# Patient Record
Sex: Male | Born: 1980 | Race: White | Hispanic: No | Marital: Married | Smoking: Never smoker
Health system: Southern US, Community
[De-identification: ages and names within clinical notes are randomized; demographics above are authoritative.]

## PROBLEM LIST (undated history)

## (undated) DIAGNOSIS — F419 Anxiety disorder, unspecified: Secondary | ICD-10-CM

## (undated) DIAGNOSIS — F209 Schizophrenia, unspecified: Secondary | ICD-10-CM

## (undated) DIAGNOSIS — H348112 Central retinal vein occlusion, right eye, stable: Secondary | ICD-10-CM

## (undated) DIAGNOSIS — F32A Depression, unspecified: Secondary | ICD-10-CM

## (undated) DIAGNOSIS — G43909 Migraine, unspecified, not intractable, without status migrainosus: Secondary | ICD-10-CM

## (undated) DIAGNOSIS — K219 Gastro-esophageal reflux disease without esophagitis: Secondary | ICD-10-CM

---
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
## 2017-08-25 DIAGNOSIS — J302 Other seasonal allergic rhinitis: Secondary | ICD-10-CM | POA: Insufficient documentation

## 2017-09-29 DIAGNOSIS — F419 Anxiety disorder, unspecified: Secondary | ICD-10-CM | POA: Insufficient documentation

## 2018-07-27 DIAGNOSIS — E6609 Other obesity due to excess calories: Secondary | ICD-10-CM | POA: Insufficient documentation

## 2019-07-22 DIAGNOSIS — F5101 Primary insomnia: Secondary | ICD-10-CM | POA: Insufficient documentation

## 2019-07-22 DIAGNOSIS — R37 Sexual dysfunction, unspecified: Secondary | ICD-10-CM | POA: Insufficient documentation

## 2019-07-22 DIAGNOSIS — E7800 Pure hypercholesterolemia, unspecified: Secondary | ICD-10-CM | POA: Insufficient documentation

## 2021-05-03 DIAGNOSIS — R55 Syncope and collapse: Secondary | ICD-10-CM

## 2021-05-03 HISTORY — DX: Syncope and collapse: R55

## 2021-05-06 ENCOUNTER — Other Ambulatory Visit: Payer: Self-pay

## 2021-05-06 ENCOUNTER — Emergency Department (HOSPITAL_COMMUNITY): Payer: Medicaid Other

## 2021-05-06 ENCOUNTER — Observation Stay (HOSPITAL_COMMUNITY)
Admission: EM | Admit: 2021-05-06 | Discharge: 2021-05-07 | DRG: 281 | Disposition: A | Discharge status: Home / Self Care | Payer: Medicaid Other | Attending: Family Medicine | Admitting: Family Medicine

## 2021-05-06 ENCOUNTER — Encounter (HOSPITAL_COMMUNITY): Payer: Self-pay | Admitting: Internal Medicine

## 2021-05-06 ENCOUNTER — Observation Stay (HOSPITAL_COMMUNITY): Payer: Medicaid Other

## 2021-05-06 DIAGNOSIS — F200 Paranoid schizophrenia: Secondary | ICD-10-CM | POA: Diagnosis present

## 2021-05-06 DIAGNOSIS — F419 Anxiety disorder, unspecified: Secondary | ICD-10-CM | POA: Diagnosis present

## 2021-05-06 DIAGNOSIS — I319 Disease of pericardium, unspecified: Secondary | ICD-10-CM | POA: Diagnosis not present

## 2021-05-06 DIAGNOSIS — H348112 Central retinal vein occlusion, right eye, stable: Secondary | ICD-10-CM | POA: Diagnosis present

## 2021-05-06 DIAGNOSIS — G43909 Migraine, unspecified, not intractable, without status migrainosus: Secondary | ICD-10-CM | POA: Diagnosis present

## 2021-05-06 DIAGNOSIS — I213 ST elevation (STEMI) myocardial infarction of unspecified site: Secondary | ICD-10-CM | POA: Diagnosis not present

## 2021-05-06 DIAGNOSIS — R55 Syncope and collapse: Secondary | ICD-10-CM | POA: Diagnosis present

## 2021-05-06 DIAGNOSIS — R778 Other specified abnormalities of plasma proteins: Secondary | ICD-10-CM | POA: Diagnosis not present

## 2021-05-06 DIAGNOSIS — F32A Depression, unspecified: Secondary | ICD-10-CM | POA: Diagnosis present

## 2021-05-06 DIAGNOSIS — Z20822 Contact with and (suspected) exposure to covid-19: Secondary | ICD-10-CM | POA: Diagnosis present

## 2021-05-06 DIAGNOSIS — K219 Gastro-esophageal reflux disease without esophagitis: Secondary | ICD-10-CM | POA: Diagnosis not present

## 2021-05-06 DIAGNOSIS — F209 Schizophrenia, unspecified: Secondary | ICD-10-CM | POA: Diagnosis present

## 2021-05-06 DIAGNOSIS — R7989 Other specified abnormal findings of blood chemistry: Secondary | ICD-10-CM | POA: Diagnosis present

## 2021-05-06 HISTORY — DX: Anxiety disorder, unspecified: F41.9

## 2021-05-06 HISTORY — DX: Gastro-esophageal reflux disease without esophagitis: K21.9

## 2021-05-06 HISTORY — DX: Schizophrenia, unspecified: F20.9

## 2021-05-06 HISTORY — DX: Central retinal vein occlusion, right eye, stable: H34.8112

## 2021-05-06 HISTORY — DX: Migraine, unspecified, not intractable, without status migrainosus: G43.909

## 2021-05-06 HISTORY — DX: Depression, unspecified: F32.A

## 2021-05-06 LAB — CBC
HCT: 39.8 % (ref 39.0–52.0)
Hemoglobin: 13.9 g/dL (ref 13.0–17.0)
MCH: 29.1 pg (ref 26.0–34.0)
MCHC: 34.9 g/dL (ref 30.0–36.0)
MCV: 83.4 fL (ref 80.0–100.0)
Platelets: 296 10*3/uL (ref 150–400)
RBC: 4.77 MIL/uL (ref 4.22–5.81)
RDW: 14 % (ref 11.5–15.5)
WBC: 11.5 10*3/uL — ABNORMAL HIGH (ref 4.0–10.5)
nRBC: 0 % (ref 0.0–0.2)

## 2021-05-06 LAB — TROPONIN I (HIGH SENSITIVITY)
Troponin I (High Sensitivity): 3400 ng/L (ref <18)
Troponin I (High Sensitivity): 3371 ng/L (ref <18)

## 2021-05-06 LAB — BASIC METABOLIC PANEL
Anion gap: 8 (ref 5–15)
BUN: 14 mg/dL (ref 6–20)
CO2: 26 mmol/L (ref 22–32)
Calcium: 9.3 mg/dL (ref 8.9–10.3)
Chloride: 103 mmol/L (ref 98–111)
Creatinine, Ser: 0.85 mg/dL (ref 0.61–1.24)
GFR, Estimated: >60 mL/min (ref >60)
Glucose, Bld: 108 mg/dL — ABNORMAL HIGH (ref 70–99)
Potassium: 3.7 mmol/L (ref 3.5–5.1)
Sodium: 137 mmol/L (ref 135–145)

## 2021-05-06 LAB — RAPID URINE DRUG SCREEN, HOSP PERFORMED
Amphetamines: NOT DETECTED
Barbiturates: NOT DETECTED
Benzodiazepines: POSITIVE — AB
Cocaine: NOT DETECTED
Opiates: NOT DETECTED
Tetrahydrocannabinol: NOT DETECTED

## 2021-05-06 LAB — HIV ANTIBODY (ROUTINE TESTING W REFLEX): HIV Screen 4th Generation wRfx: NONREACTIVE

## 2021-05-06 LAB — RESP PANEL BY RT-PCR (FLU A&B, COVID) ARPGX2
Influenza A by PCR: NEGATIVE
Influenza B by PCR: NEGATIVE
SARS Coronavirus 2 by RT PCR: NEGATIVE

## 2021-05-06 IMAGING — CR DG CHEST 1V
1 series · 1 of 1 positions shown · non-contrast
Comparison: Radiograph [DATE].  CT [DATE].

CLINICAL DATA: Lightheadedness and dizziness. Syncopal episode
earlier today.

EXAM:
CHEST  1 VIEW

[chest pa]
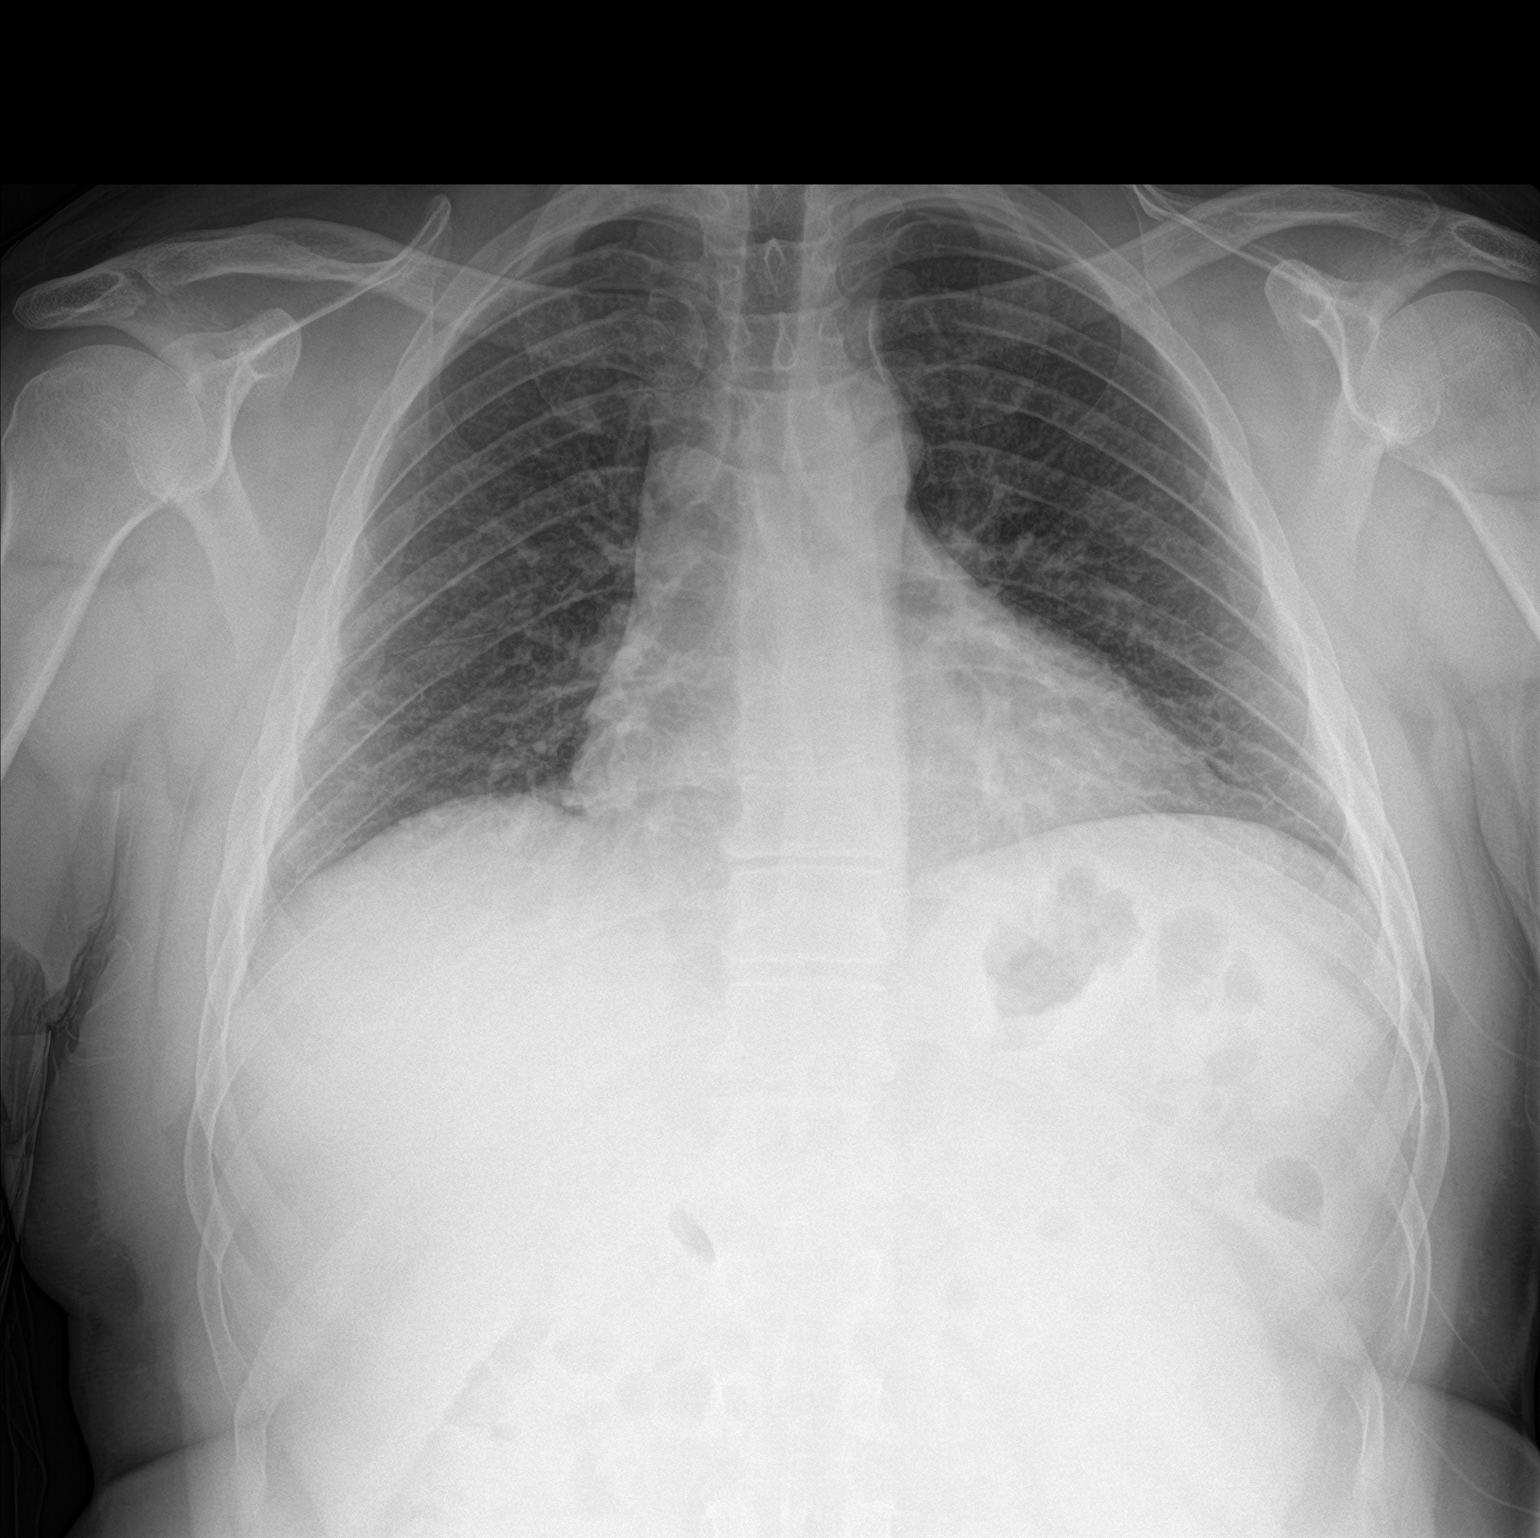

[1 of 1 positions shown; findings below may reference images not displayed]

FINDINGS: [FD] hours. Low lung volumes. The heart size and mediastinal
contours are stable. There is central airway thickening, but no
edema, confluent airspace opacity, pleural effusion or pneumothorax.
The bones appear unremarkable.
IMPRESSION: Suboptimal inspiration and mild central airway thickening. No focal
airspace disease.

## 2021-05-06 IMAGING — CT CT ANGIO HEAD-NECK (W OR W/O PERF)
2 of 7 series · 8 of 33 positions shown · non-contrast
Comparison: None.

CLINICAL DATA: Dizziness and lightheadedness

EXAM:
CT ANGIOGRAPHY HEAD AND NECK
TECHNIQUE: Multidetector CT imaging of the head and neck was performed using
the standard protocol during bolus administration of intravenous
contrast. Multiplanar CT image reconstructions and MIPs were
obtained to evaluate the vascular anatomy. Carotid stenosis
measurements (when applicable) are obtained utilizing NASCET
criteria, using the distal internal carotid diameter as the
denominator.

[Series 5: cta neck · axial · 0.49mm/px · z∈[-178,-64]mm · 2 of 172 slices shown]
[im 58/172  soft-tissue]
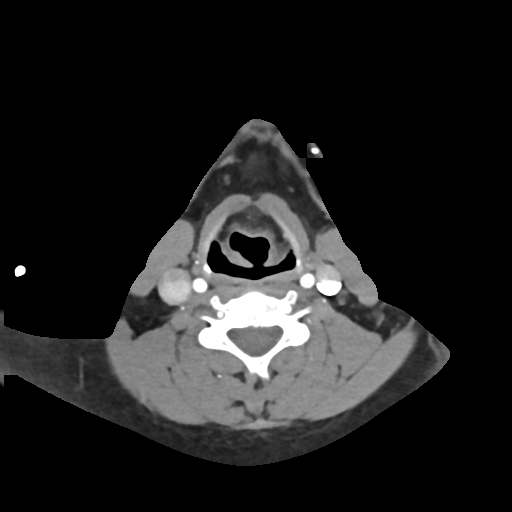
[im 115/172  soft-tissue]
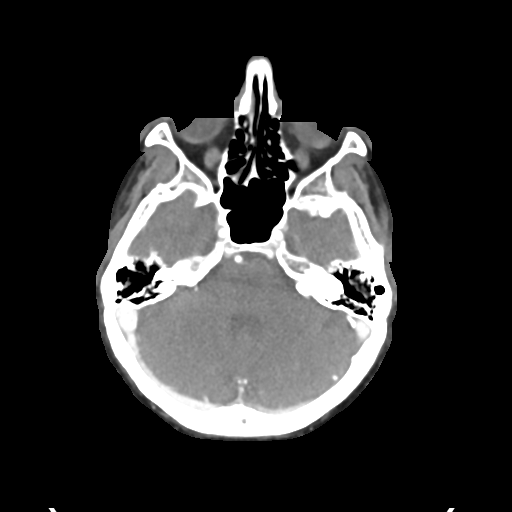

[Series 7: cta neck axial · axial · 0.39mm/px · z∈[-242,+3]mm · 6 of 344 slices shown]
[im 50/344  soft-tissue]
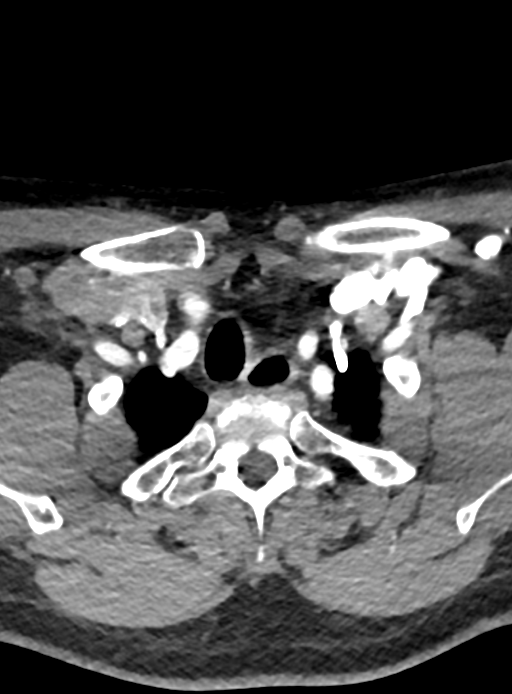
[im 99/344  bone]
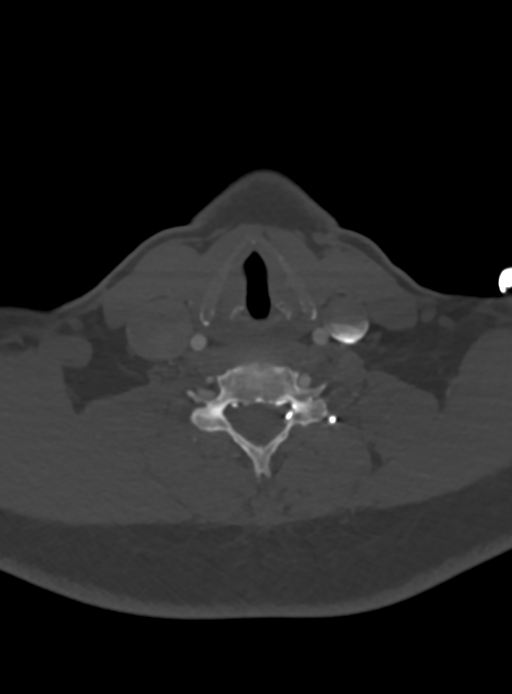
[im 148/344  soft-tissue]
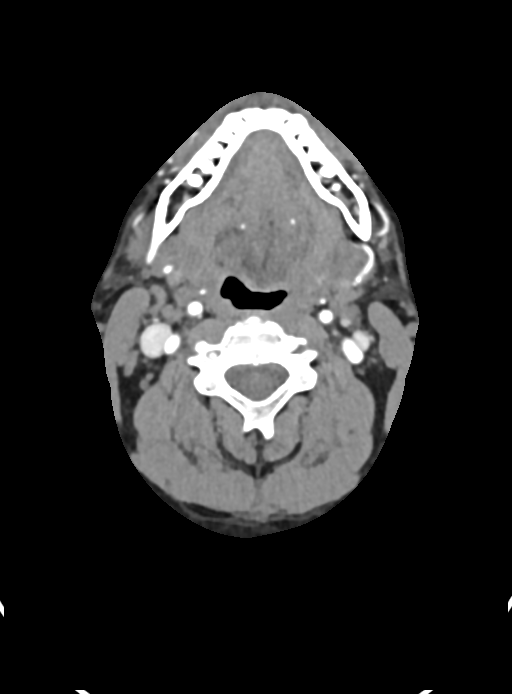
[im 197/344  bone]
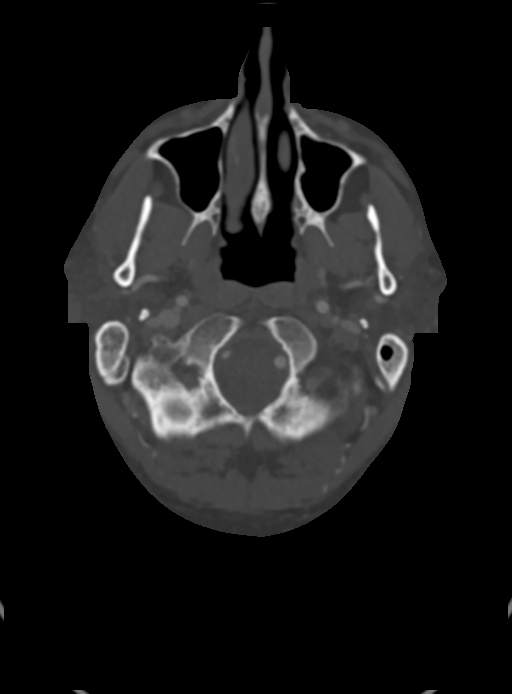
[im 246/344  soft-tissue]
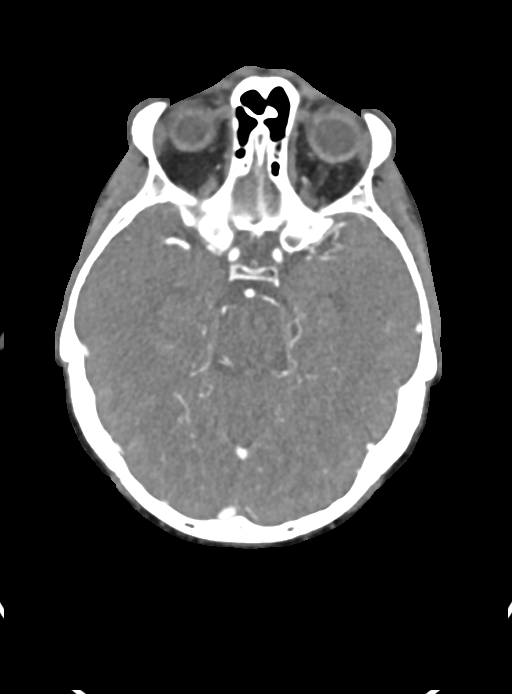
[im 295/344  bone]
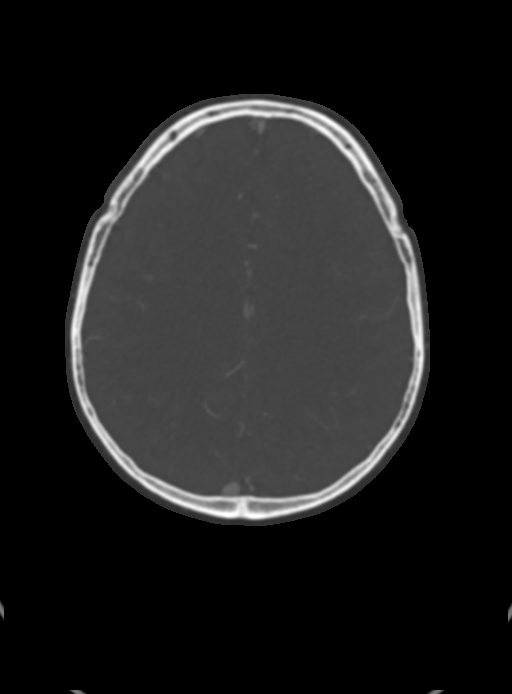

[8 of 33 positions shown; findings below may reference images not displayed]

RADIATION DOSE REDUCTION: This exam was performed according to the
departmental dose-optimization program which includes automated
exposure control, adjustment of the mA and/or kV according to
patient size and/or use of iterative reconstruction technique.

CONTRAST:  75mL OMNIPAQUE IOHEXOL 350 MG/ML SOLN
FINDINGS: CTA NECK FINDINGS

SKELETON: There is no bony spinal canal stenosis. No lytic or
blastic lesion.

OTHER NECK: Normal pharynx, larynx and major salivary glands. No
cervical lymphadenopathy. Unremarkable thyroid gland.

UPPER CHEST: No pneumothorax or pleural effusion. No nodules or
masses.

AORTIC ARCH:

There is no calcific atherosclerosis of the aortic arch. There is no
aneurysm, dissection or hemodynamically significant stenosis of the
visualized portion of the aorta. Conventional 3 vessel aortic
branching pattern. The visualized proximal subclavian arteries are
widely patent.

RIGHT CAROTID SYSTEM: Normal without aneurysm, dissection or
stenosis.

LEFT CAROTID SYSTEM: Normal without aneurysm, dissection or
stenosis.

VERTEBRAL ARTERIES: Left dominant configuration. Both origins are
clearly patent. There is no dissection, occlusion or flow-limiting
stenosis to the skull base (V1-V3 segments).

CTA HEAD FINDINGS

POSTERIOR CIRCULATION:

--Vertebral arteries: Normal V4 segments.

--Inferior cerebellar arteries: Normal.

--Basilar artery: Normal.

--Superior cerebellar arteries: Normal.

--Posterior cerebral arteries (PCA): Normal.

ANTERIOR CIRCULATION:

--Intracranial internal carotid arteries: Normal.

--Anterior cerebral arteries (ACA): Normal. Both A1 segments are
present. Patent anterior communicating artery (a-comm).

--Middle cerebral arteries (MCA): Normal.

VENOUS SINUSES: As permitted by contrast timing, patent.

ANATOMIC VARIANTS: None

Review of the MIP images confirms the above findings.
IMPRESSION: Normal CTA of the head and neck.
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
## 2021-05-06 MED ORDER — LACTATED RINGERS IV SOLN: INTRAVENOUS | Status: DC

## 2021-05-06 MED ORDER — ARIPIPRAZOLE 10 MG PO TABS
20.0000 mg | ORAL_TABLET | Freq: Every day | ORAL | Status: DC
  Administered 2021-05-06: 20 mg via ORAL
  Filled 2021-05-06 (×2): qty 2

## 2021-05-06 MED ORDER — ACETAMINOPHEN 325 MG PO TABS: 650.0000 mg | ORAL_TABLET | Freq: Four times a day (QID) | ORAL | Status: DC | PRN

## 2021-05-06 MED ORDER — ACETAMINOPHEN 650 MG RE SUPP: 650.0000 mg | Freq: Four times a day (QID) | RECTAL | Status: DC | PRN

## 2021-05-06 MED ORDER — TEMAZEPAM 30 MG PO CAPS
30.0000 mg | ORAL_CAPSULE | Freq: Every day | ORAL | Status: DC
  Administered 2021-05-06: 30 mg via ORAL
  Filled 2021-05-06: qty 1

## 2021-05-06 MED ORDER — IOHEXOL 350 MG/ML SOLN
75.0000 mL | Freq: Once | INTRAVENOUS | Status: AC | PRN
  Administered 2021-05-06: 75 mL via INTRAVENOUS

## 2021-05-06 MED ORDER — ENOXAPARIN SODIUM 40 MG/0.4ML IJ SOSY
40.0000 mg | PREFILLED_SYRINGE | INTRAMUSCULAR | Status: DC
  Administered 2021-05-06: 40 mg via SUBCUTANEOUS
  Filled 2021-05-06: qty 0.4

## 2021-05-06 MED ORDER — ONDANSETRON HCL 4 MG/2ML IJ SOLN: 4.0000 mg | Freq: Four times a day (QID) | INTRAMUSCULAR | Status: DC | PRN

## 2021-05-06 MED ORDER — SODIUM CHLORIDE 0.9% FLUSH
3.0000 mL | Freq: Two times a day (BID) | INTRAVENOUS | Status: DC
  Administered 2021-05-07: 3 mL via INTRAVENOUS

## 2021-05-06 MED ORDER — ASPIRIN EC 81 MG PO TBEC: 81.0000 mg | DELAYED_RELEASE_TABLET | Freq: Every day | ORAL | Status: DC

## 2021-05-06 MED ORDER — ONDANSETRON HCL 4 MG PO TABS: 4.0000 mg | ORAL_TABLET | Freq: Four times a day (QID) | ORAL | Status: DC | PRN

## 2021-05-06 MED ORDER — HYDROXYZINE HCL 25 MG PO TABS
50.0000 mg | ORAL_TABLET | Freq: Every evening | ORAL | Status: DC
  Administered 2021-05-06: 50 mg via ORAL
  Filled 2021-05-06: qty 2

## 2021-05-06 MED ORDER — ASPIRIN 81 MG PO CHEW
324.0000 mg | CHEWABLE_TABLET | Freq: Once | ORAL | Status: AC
  Administered 2021-05-06: 324 mg via ORAL
  Filled 2021-05-06: qty 4

## 2021-05-06 MED ORDER — PANTOPRAZOLE SODIUM 40 MG PO TBEC
40.0000 mg | DELAYED_RELEASE_TABLET | Freq: Every day | ORAL | Status: DC
  Administered 2021-05-06: 40 mg via ORAL
  Filled 2021-05-06: qty 1

## 2021-05-06 NOTE — Progress Notes (Signed)
EEG complete - results pending 
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
## 2021-05-06 NOTE — Procedures (Signed)
Patient Name: [name]  ?MRN: [id number]  ?Epilepsy Attending: Charlsie Quest  ?Referring Physician/Provider: Jonah Blue, MD ?Date: 05/06/2021 ?Duration: 23.09 mins ? ?Patient history: 41 year old male with recurrent episodes of syncope.  Given his previous description of presyncope with blood donation and syncope associated with vaccination. EEG to evaluate for seizure. ? ?Level of alertness: Awake ? ?AEDs during EEG study: None ? ?Technical aspects: This EEG study was done with scalp electrodes positioned according to the 10-20 International system of electrode placement. Electrical activity was acquired at a sampling rate of 500Hz  and reviewed with a high frequency filter of 70Hz  and a low frequency filter of 1Hz . EEG data were recorded continuously and digitally stored.  ? ?Description: The posterior dominant rhythm consists of 9 Hz activity of moderate voltage (25-35 uV) seen predominantly in posterior head regions, symmetric and reactive to eye opening and eye closing. Physiologic photic driving was seen during photic stimulation.  Hyperventilation was not performed.    ? ?IMPRESSION: ?This study is within normal limits. No seizures or epileptiform discharges were seen throughout the recording. ? ?  ? ?

## 2021-05-06 NOTE — ED Notes (Signed)
Critical Troponin of 3,400 reported to [facility] in triage, charge RN made aware.  ?
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
## 2021-05-07 ENCOUNTER — Observation Stay (HOSPITAL_COMMUNITY): Payer: Medicaid Other

## 2021-05-07 ENCOUNTER — Encounter: Payer: Self-pay | Admitting: Family Medicine

## 2021-05-07 ENCOUNTER — Other Ambulatory Visit: Payer: Self-pay | Admitting: Cardiology

## 2021-05-07 ENCOUNTER — Observation Stay (HOSPITAL_BASED_OUTPATIENT_CLINIC_OR_DEPARTMENT_OTHER)
Admit: 2021-05-07 | Discharge: 2021-05-07 | Disposition: A | Discharge status: Home / Self Care | Payer: Medicaid Other | Attending: Cardiovascular Disease | Admitting: Cardiovascular Disease

## 2021-05-07 DIAGNOSIS — R55 Syncope and collapse: Secondary | ICD-10-CM

## 2021-05-07 DIAGNOSIS — I401 Isolated myocarditis: Secondary | ICD-10-CM | POA: Diagnosis not present

## 2021-05-07 DIAGNOSIS — R778 Other specified abnormalities of plasma proteins: Secondary | ICD-10-CM | POA: Diagnosis not present

## 2021-05-07 LAB — BASIC METABOLIC PANEL
Anion gap: 6 (ref 5–15)
BUN: 16 mg/dL (ref 6–20)
CO2: 27 mmol/L (ref 22–32)
Calcium: 9.1 mg/dL (ref 8.9–10.3)
Chloride: 103 mmol/L (ref 98–111)
Creatinine, Ser: 0.8 mg/dL (ref 0.61–1.24)
GFR, Estimated: >60 mL/min (ref >60)
Glucose, Bld: 101 mg/dL — ABNORMAL HIGH (ref 70–99)
Potassium: 4.3 mmol/L (ref 3.5–5.1)
Sodium: 136 mmol/L (ref 135–145)

## 2021-05-07 LAB — CBC
HCT: 38.6 % — ABNORMAL LOW (ref 39.0–52.0)
Hemoglobin: 13.4 g/dL (ref 13.0–17.0)
MCH: 29 pg (ref 26.0–34.0)
MCHC: 34.7 g/dL (ref 30.0–36.0)
MCV: 83.5 fL (ref 80.0–100.0)
Platelets: 300 10*3/uL (ref 150–400)
RBC: 4.62 MIL/uL (ref 4.22–5.81)
RDW: 14.1 % (ref 11.5–15.5)
WBC: 9.7 10*3/uL (ref 4.0–10.5)
nRBC: 0 % (ref 0.0–0.2)

## 2021-05-07 LAB — TROPONIN I (HIGH SENSITIVITY): Troponin I (High Sensitivity): 2444 ng/L (ref <18)

## 2021-05-07 IMAGING — MR MR HEAD W/O CM
14 of 15 series · 44 of 48 positions shown · non-contrast
Comparison: Head CT and CTA from yesterday

CLINICAL DATA: Syncope/presyncope with cerebrovascular cause
suspected

EXAM:
MRI HEAD WITHOUT CONTRAST
TECHNIQUE: Multiplanar, multiecho pulse sequences of the brain and surrounding
structures were obtained without intravenous contrast.

[Series 5: DWI · axial · 3.0mm · 0.88mm/px · z∈[-71,+86]mm · 8 of 108 slices shown (1 of 4)]
[im 1/108]
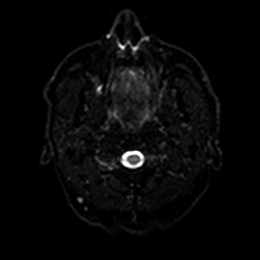
[im 16/108]
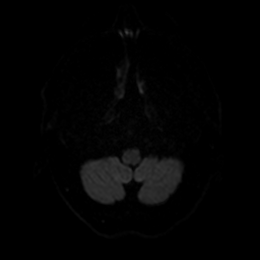
[im 31/108]
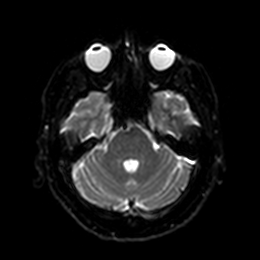
[im 46/108]
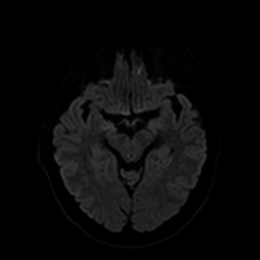
[im 62/108]
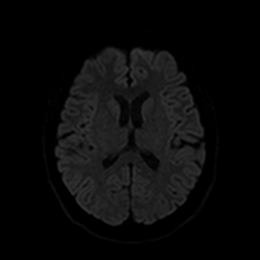
[im 77/108]
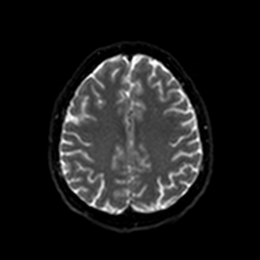
[im 92/108]
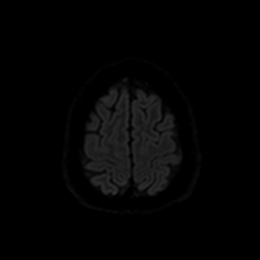
[im 108/108]
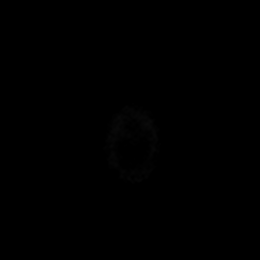

[Series 6: DWI · axial · 3.0mm · 0.88mm/px · z∈[-71,+86]mm · 3 of 54 slices shown (2 of 4)]
[im 1/54]
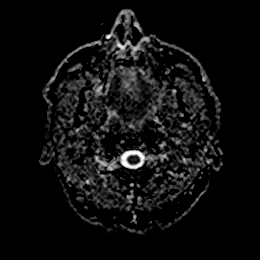
[im 27/54]
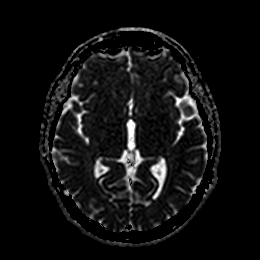
[im 54/54]
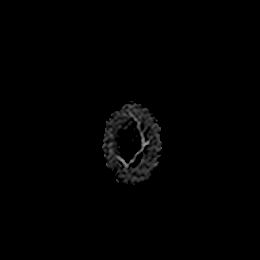

[Series 7: DWI · coronal · 4.0mm · 0.88mm/px · 5 of 76 slices shown (3 of 4)]
[im 1/76]
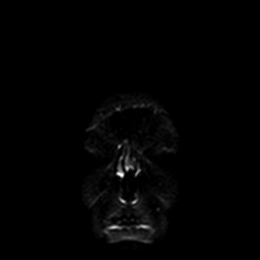
[im 19/76]
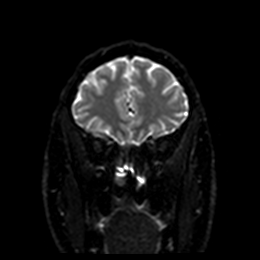
[im 38/76]
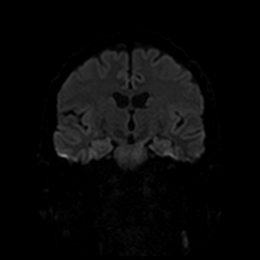
[im 57/76]
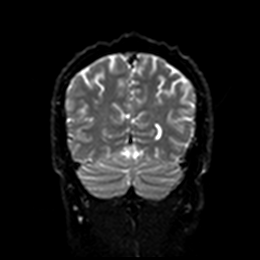
[im 76/76]
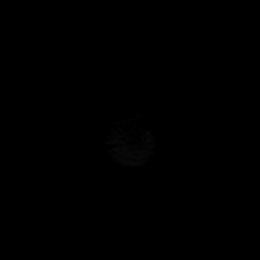

[Series 8: DWI · coronal · 4.0mm · 0.88mm/px · 2 of 38 slices shown (4 of 4)]
[im 1/38]
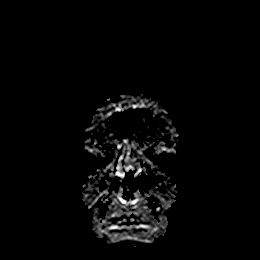
[im 38/38]
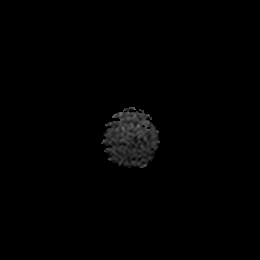

[Series 9: T1 · sagittal · 5.0mm · 0.78mm/px · 1 of 23 slices shown]
[im 1/23]
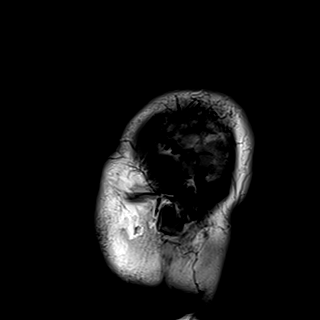

[Series 10: T2 · axial · 5.0mm · 0.72mm/px · z∈[-82,+59]mm · 2 of 25 slices shown (1 of 3)]
[im 1/25]
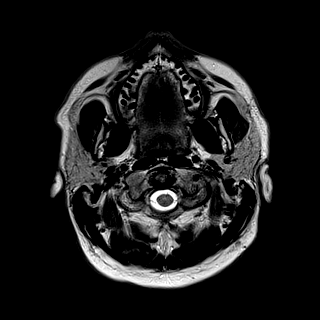
[im 25/25]
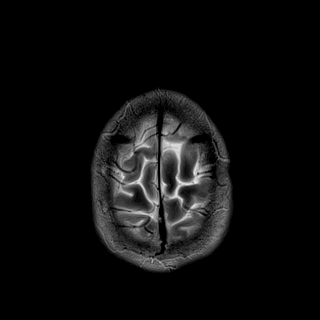

[Series 11: FLAIR · axial · 5.0mm · 0.45mm/px · z∈[-83,+58]mm · 2 of 25 slices shown (1 of 2)]
[im 1/25]
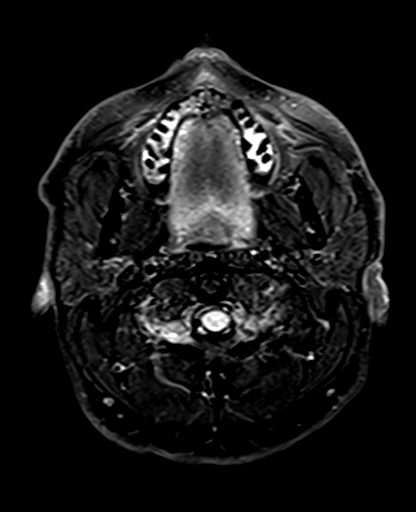
[im 25/25]
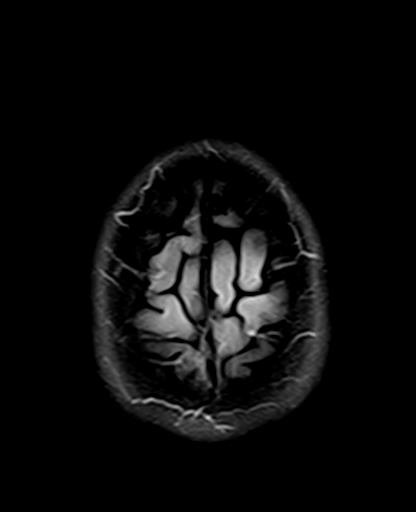

[Series 12: mag_images · axial · 3.0mm · 0.90mm/px · z∈[-77,+98]mm · 4 of 60 slices shown]
[im 1/60]
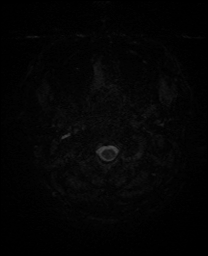
[im 20/60]
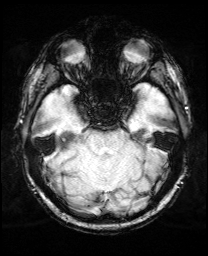
[im 40/60]
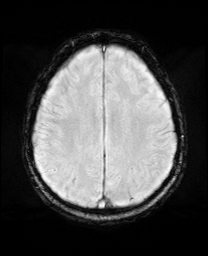
[im 60/60]
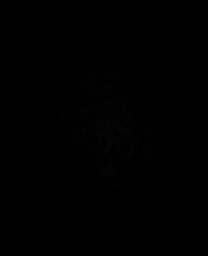

[Series 13: pha_images · axial · 3.0mm · 0.90mm/px · z∈[-77,+95]mm · 4 of 58 slices shown]
[im 1/58]
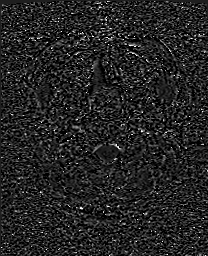
[im 20/58]
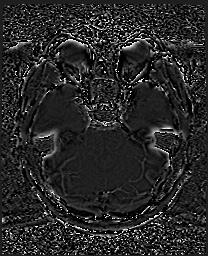
[im 39/58]
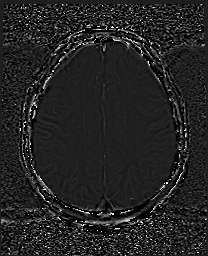
[im 58/58]
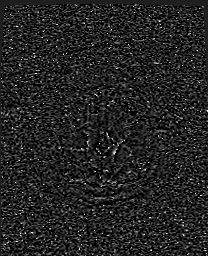

[Series 14: swi_images · axial · 3.0mm · 0.90mm/px · z∈[-77,+98]mm · 4 of 60 slices shown]
[im 1/60]
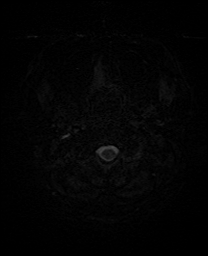
[im 20/60]
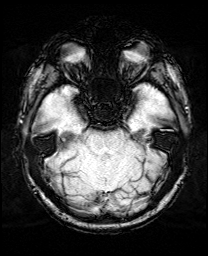
[im 40/60]
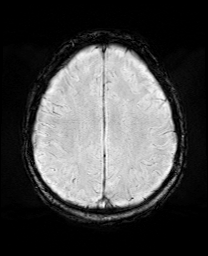
[im 60/60]
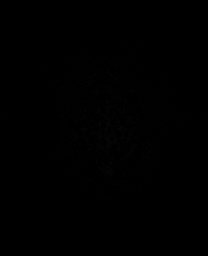

[Series 15: mip_images(sw) · axial · 24.0mm · 0.90mm/px · z∈[-67,+87]mm · 3 of 53 slices shown]
[im 1/53]
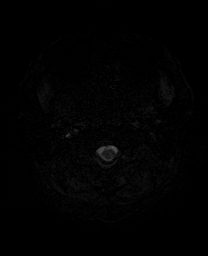
[im 27/53]
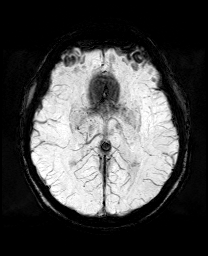
[im 53/53]
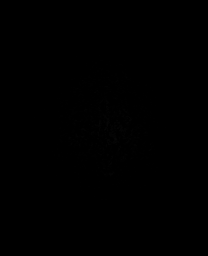

[Series 17: T2 · coronal · 5.0mm · 0.34mm/px · 2 of 29 slices shown (2 of 3)]
[im 1/29]
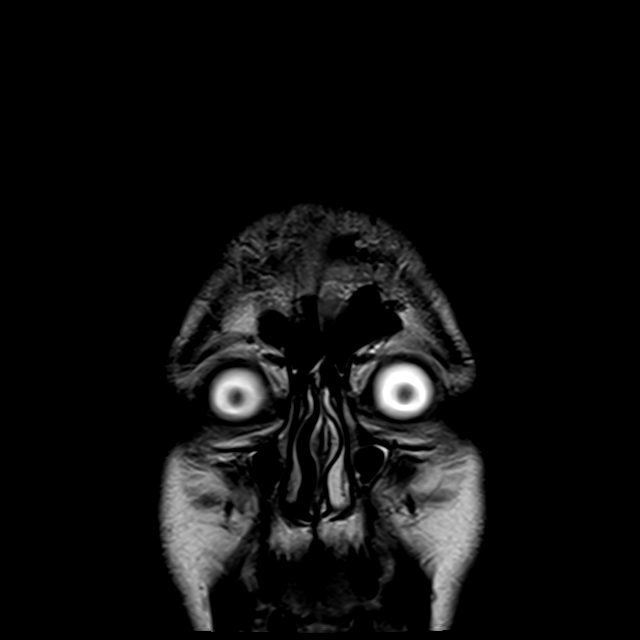
[im 29/29]
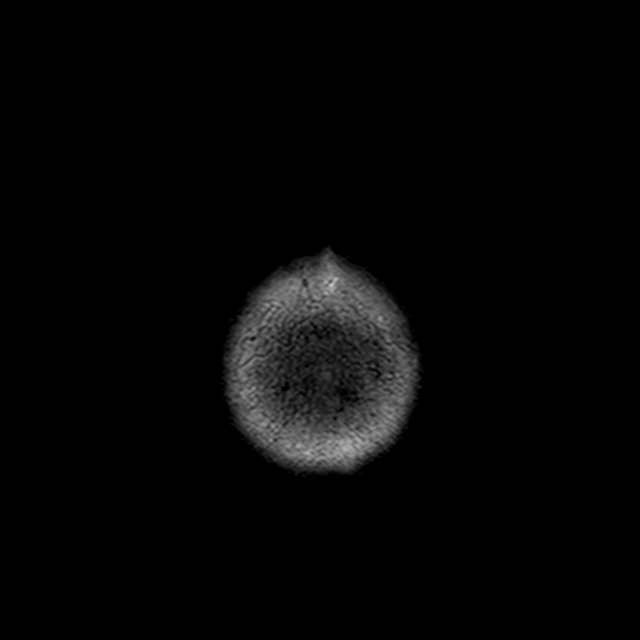

[Series 18: T2 · oblique · 3.0mm · 0.30mm/px · 2 of 38 slices shown (3 of 3)]
[im 1/38]
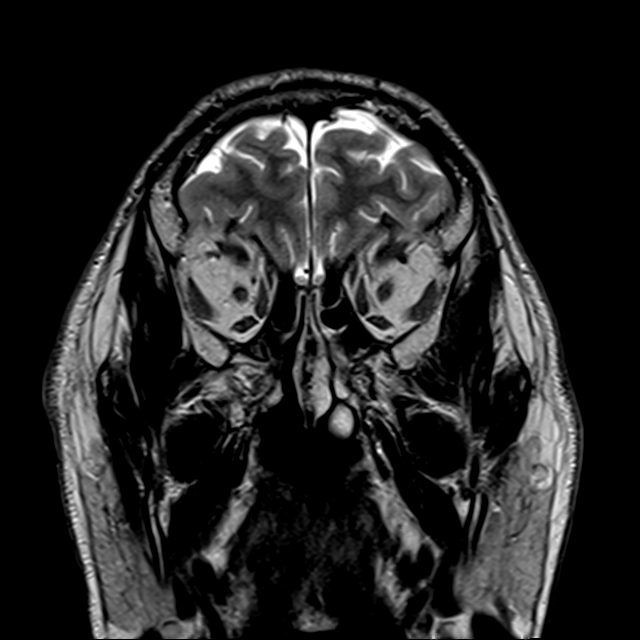
[im 38/38]
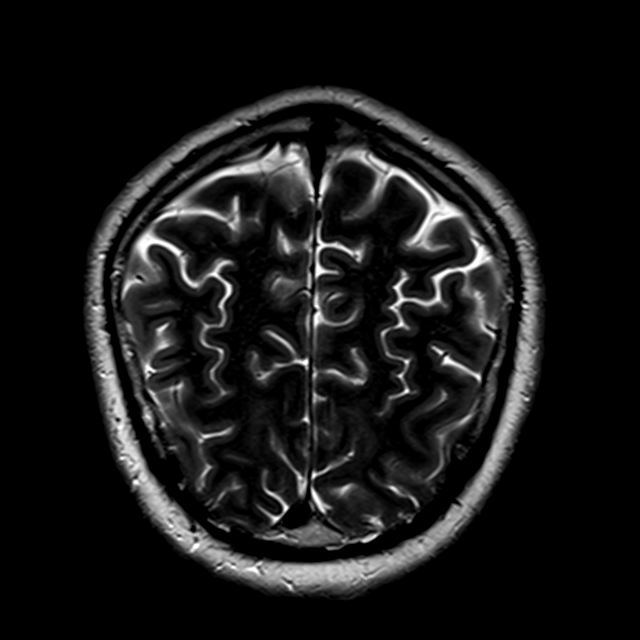

[Series 19: FLAIR · oblique · 3.0mm · 0.59mm/px · 2 of 38 slices shown (2 of 2)]
[im 1/38]
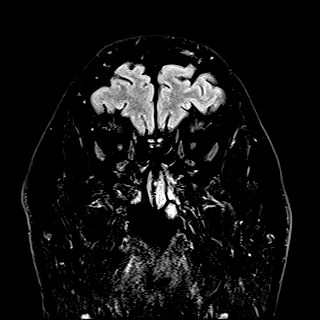
[im 38/38]
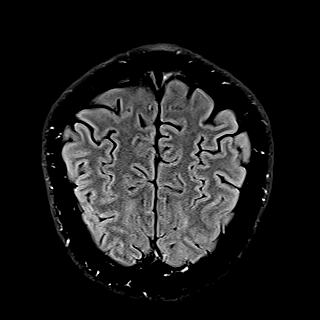

[44 of 48 positions shown; findings below may reference images not displayed]

FINDINGS: Brain: No infarction, hemorrhage, hydrocephalus, extra-axial
collection or mass lesion. No white matter disease or atrophy

Vascular: Preceding CTA.  Normal flow voids

Skull and upper cervical spine: Normal marrow signal

Sinuses/Orbits: Negative
IMPRESSION: Normal MRI of the brain.

## 2021-05-07 IMAGING — MR MR CARD MORPHOLOGY WO/W CM
45 of 48 series · 45 of 48 positions shown · IV contrast (Contrast agent)
Comparison: none

CLINICAL DATA: Clinical question of myocarditis
Study assumes HCT of 38.6  And BSA of 2.15 m2.

EXAM:
CARDIAC MRI
TECHNIQUE: The patient was scanned on a 1.5 Tesla GE magnet. A dedicated
cardiac coil was used. Functional imaging was done using Fiesta
sequences. [DATE], and 4 chamber views were done to assess for RWMA's.
Modified SOKA rule using a short axis stack was used to
calculate an ejection fraction on a dedicated work station using
Circle software. The patient received 12 cc of Gadavist. After 10
minutes inversion recovery sequences were used to assess for
infiltration and scar tissue.
CONTRAST:  12 cc  of Gadavist

[Series 4: t2_haste_db_tra_bh · axial · 8.0mm · 1.41mm/px · 1 of 16 slices shown]
[im 1/16]
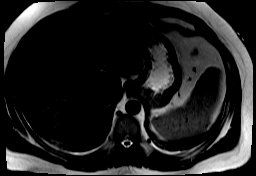

[Series 8: bSSFP · oblique · 8.0mm · 1.61mm/px · 1 of 25 slices shown (1 of 21)]
[im 1/25]
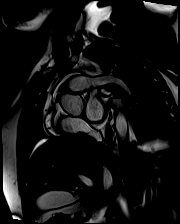

[Series 9: bSSFP · oblique · 8.0mm · 1.61mm/px · 1 of 25 slices shown (2 of 21)]
[im 1/25]
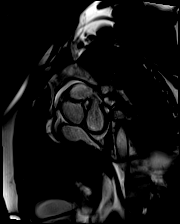

[Series 10: bSSFP · oblique · 8.0mm · 1.61mm/px · 1 of 25 slices shown (3 of 21)]
[im 1/25]
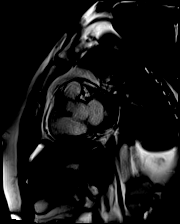

[Series 11: bSSFP · oblique · 8.0mm · 1.61mm/px · 1 of 25 slices shown (4 of 21)]
[im 1/25]
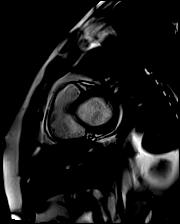

[Series 12: bSSFP · oblique · 8.0mm · 1.61mm/px · 1 of 25 slices shown (5 of 21)]
[im 1/25]
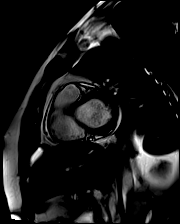

[Series 13: bSSFP · oblique · 8.0mm · 1.61mm/px · 1 of 25 slices shown (6 of 21)]
[im 1/25]
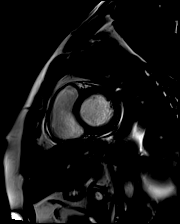

[Series 14: bSSFP · oblique · 8.0mm · 1.61mm/px · 1 of 25 slices shown (7 of 21)]
[im 1/25]
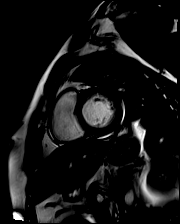

[Series 15: bSSFP · oblique · 8.0mm · 1.61mm/px · 1 of 25 slices shown (8 of 21)]
[im 1/25]
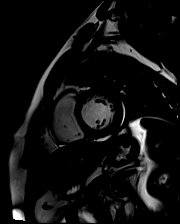

[Series 16: bSSFP · oblique · 8.0mm · 1.61mm/px · 1 of 25 slices shown (9 of 21)]
[im 1/25]
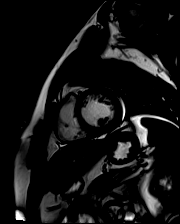

[Series 17: bSSFP · oblique · 8.0mm · 1.61mm/px · 1 of 25 slices shown (10 of 21)]
[im 1/25]
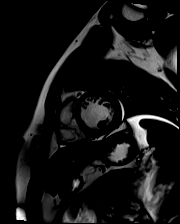

[Series 18: bSSFP · oblique · 8.0mm · 1.61mm/px · 1 of 25 slices shown (11 of 21)]
[im 1/25]
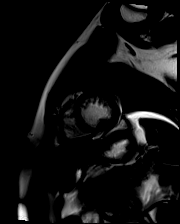

[Series 19: bSSFP · oblique · 8.0mm · 1.61mm/px · 1 of 25 slices shown (12 of 21)]
[im 1/25]
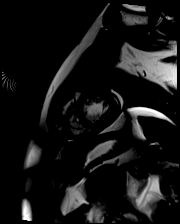

[Series 20: bSSFP · oblique · 8.0mm · 1.61mm/px · 1 of 25 slices shown (13 of 21)]
[im 1/25]
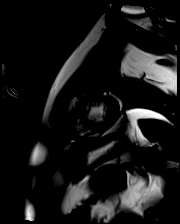

[Series 21: bSSFP · oblique · 8.0mm · 1.61mm/px · 1 of 25 slices shown (14 of 21)]
[im 1/25]
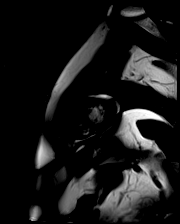

[Series 22: bSSFP · oblique · 8.0mm · 1.61mm/px · 1 of 25 slices shown (15 of 21)]
[im 1/25]
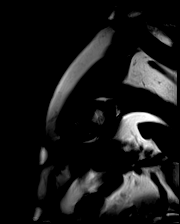

[Series 23: bSSFP · oblique · 8.0mm · 1.61mm/px · 1 of 25 slices shown (16 of 21)]
[im 1/25]
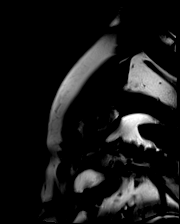

[Series 24: bSSFP · oblique · 8.0mm · 1.61mm/px · 1 of 25 slices shown (17 of 21)]
[im 1/25]
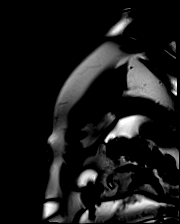

[Series 25: bSSFP · oblique · 8.0mm · 1.61mm/px · 1 of 25 slices shown (18 of 21)]
[im 1/25]
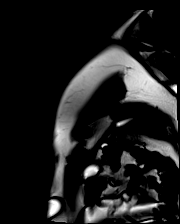

[Series 26: bSSFP · oblique · 6.0mm · 1.41mm/px · 1 of 25 slices shown (19 of 21)]
[im 1/25]
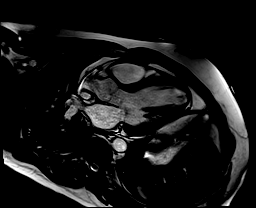

[Series 27: bSSFP · oblique · 6.0mm · 1.41mm/px · 1 of 25 slices shown (20 of 21)]
[im 1/25]
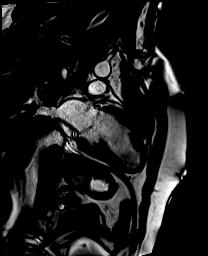

[Series 28: bSSFP · axial · 6.0mm · 1.41mm/px · 1 of 25 slices shown (21 of 21)]
[im 1/25]
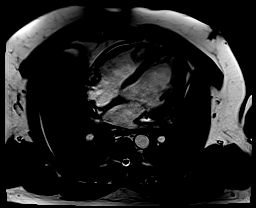

[Series 29: cine_trufi_short axis_cs_2_shot · oblique · 8.0mm · 1.48mm/px · 1 of 25 slices shown (1 of 17)]
[im 1/25]
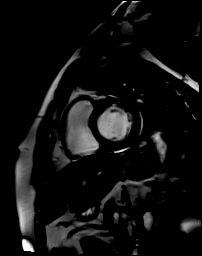

[Series 29: cine_trufi_short axis_cs_2_shot · oblique · 8.0mm · 1.48mm/px · 1 of 25 slices shown (2 of 17)]
[im 1/25]
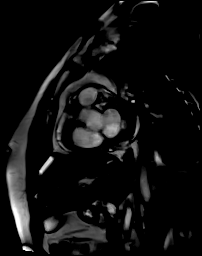

[Series 29: cine_trufi_short axis_cs_2_shot · oblique · 8.0mm · 1.48mm/px · 1 of 25 slices shown (3 of 17)]
[im 1/25]
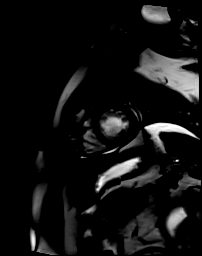

[Series 29: cine_trufi_short axis_cs_2_shot · oblique · 8.0mm · 1.48mm/px · 1 of 25 slices shown (4 of 17)]
[im 1/25]
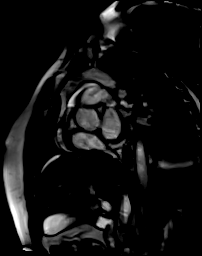

[Series 29: cine_trufi_short axis_cs_2_shot · oblique · 8.0mm · 1.48mm/px · 1 of 25 slices shown (5 of 17)]
[im 1/25]
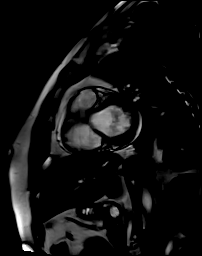

[Series 29: cine_trufi_short axis_cs_2_shot · oblique · 8.0mm · 1.48mm/px · 1 of 25 slices shown (6 of 17)]
[im 1/25]
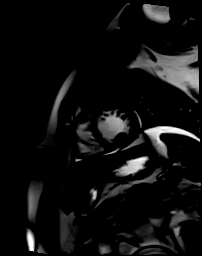

[Series 29: cine_trufi_short axis_cs_2_shot · oblique · 8.0mm · 1.48mm/px · 1 of 25 slices shown (7 of 17)]
[im 1/25]
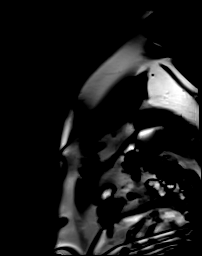

[Series 29: cine_trufi_short axis_cs_2_shot · oblique · 8.0mm · 1.48mm/px · 1 of 25 slices shown (8 of 17)]
[im 1/25]
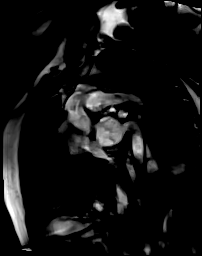

[Series 29: cine_trufi_short axis_cs_2_shot · oblique · 8.0mm · 1.48mm/px · 1 of 25 slices shown (9 of 17)]
[im 1/25]
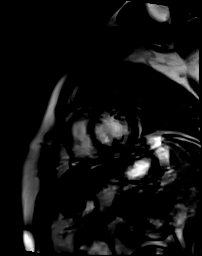

[Series 29: cine_trufi_short axis_cs_2_shot · oblique · 8.0mm · 1.48mm/px · 1 of 25 slices shown (10 of 17)]
[im 1/25]
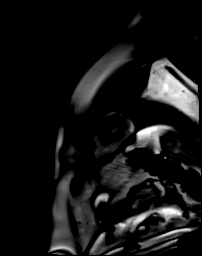

[Series 29: cine_trufi_short axis_cs_2_shot · oblique · 8.0mm · 1.48mm/px · 1 of 25 slices shown (11 of 17)]
[im 1/25]
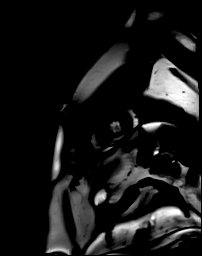

[Series 29: cine_trufi_short axis_cs_2_shot · oblique · 8.0mm · 1.48mm/px · 1 of 25 slices shown (12 of 17)]
[im 1/25]
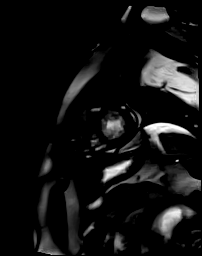

[Series 29: cine_trufi_short axis_cs_2_shot · oblique · 8.0mm · 1.48mm/px · 1 of 25 slices shown (13 of 17)]
[im 1/25]
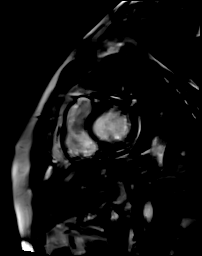

[Series 29: cine_trufi_short axis_cs_2_shot · oblique · 8.0mm · 1.48mm/px · 1 of 25 slices shown (14 of 17)]
[im 1/25]
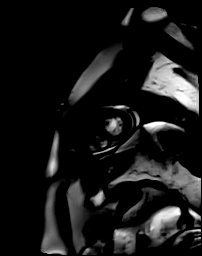

[Series 29: cine_trufi_short axis_cs_2_shot · oblique · 8.0mm · 1.48mm/px · 1 of 25 slices shown (15 of 17)]
[im 1/25]
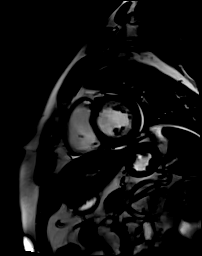

[Series 29: cine_trufi_short axis_cs_2_shot · oblique · 8.0mm · 1.48mm/px · 1 of 25 slices shown (16 of 17)]
[im 1/25]
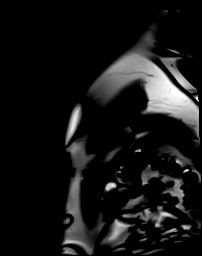

[Series 29: cine_trufi_short axis_cs_2_shot · oblique · 8.0mm · 1.48mm/px · 1 of 25 slices shown (17 of 17)]
[im 1/25]
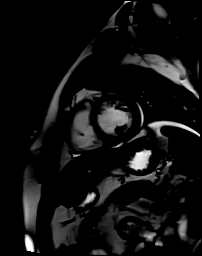

[Series 31: STIR · oblique · 8.0mm · 1.97mm/px · 1 of 15 slices shown]
[im 1/15]
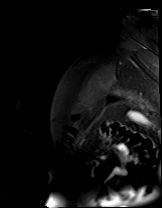

[Series 32: pre short axis · oblique · non-contrast · 8.0mm · 2.25mm/px · 1 of 10 slices shown (1 of 5)]
[im 1/10]
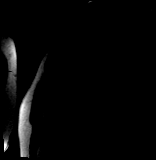

[Series 33: pre short axis · oblique · non-contrast · 8.0mm · 2.25mm/px · 1 of 10 slices shown (2 of 5)]
[im 1/10]
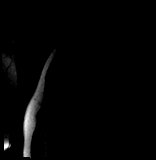

[Series 34: pre short axis · oblique · non-contrast · 8.0mm · 2.25mm/px · 1 of 10 slices shown (3 of 5)]
[im 1/10]
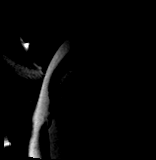

[Series 35: pre short axis · oblique · non-contrast · 8.0mm · 2.25mm/px · 1 of 10 slices shown (4 of 5)]
[im 1/10]
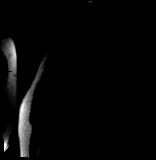

[Series 36: pre short axis · oblique · non-contrast · 8.0mm · 2.25mm/px · 1 of 10 slices shown (5 of 5)]
[im 1/10]
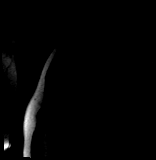

[45 of 48 positions shown; findings below may reference images not displayed]

FINDINGS: 1. Normal left ventricular size, with LVEDD 58 mm, and LVEDVi 79
mL/m2.

Normal left ventricular thickness, with intraventricular septal
thickness of 7 mm, posterior wall thickness of 9 mm, and myocardial
mass index of 56 g/m2.

Low normal left ventricular systolic function (LVEF =52%). There is
mid and basal lateral hypokinesis (anterolateral and inferolateral).

Left ventricular parametric mapping notable for increase T2 signal
in the mid anterior, lateral, inferior, and in the base lateral and
inferior distributions (highest 79 ms).

Elevated in the ECV signal in the lateral base and mid (highest
signal, 43% mid anterolateral).

There is late gadolinium enhancement in the left ventricular
myocardium- there is patchy LGE in the basal inferior,
inferolateral, anterolateral, into the mid, lateral distribution.
This is not an ischemic pattern of LGE.

2. Normal right ventricular size with RVEDVI 81 mL/m2.

Normal right ventricular thickness.

Low normal right ventricular systolic function (RVEF =44%). There
are no regional wall motion abnormalities or aneurysms.

3.  Normal left and right atrial size.

4. Normal size of the aortic root, ascending aorta and pulmonary
artery.

5. Valve assessment:

Aortic Valve: Tri-leaflet aortic valve. Qualitatively, there is no
significant regurgitation.

Pulmonic Valve: Qualitatively, there is no significant
regurgitation.

Tricuspid Valve: Qualitatively, there is no significant
regurgitation.

Mitral Valve: There is no significant regurgitation.

6. Normal pericardium. Trivial pericardial effusion anterior to the
right ventricle and posterior to the left ventricle. No pericardial
thickening. No pericardial enhancement.

7. Grossly, no extracardiac findings. Recommended dedicated study if
concerned for non-cardiac pathology.

8. Wrap around and cardiac motion artifacts noted. This decreased
the sensitivity of volumetric assessments (RV quantification).
IMPRESSION: Modified SOKA Criteria is met for Myocarditis.

## 2021-05-07 MED ORDER — TRAMADOL HCL 50 MG PO TABS
50.0000 mg | ORAL_TABLET | Freq: Four times a day (QID) | ORAL | Status: DC | PRN
Start: 2021-05-07 — End: 2021-05-07

## 2021-05-07 MED ORDER — COLCHICINE 0.6 MG PO TABS
0.6000 mg | ORAL_TABLET | Freq: Every day | ORAL | Status: DC
  Administered 2021-05-07: 0.6 mg via ORAL
  Filled 2021-05-07: qty 1

## 2021-05-07 MED ORDER — METOPROLOL SUCCINATE ER 25 MG PO TB24
12.5000 mg | ORAL_TABLET | Freq: Every day | ORAL | Status: DC
  Administered 2021-05-07: 12.5 mg via ORAL
  Filled 2021-05-07: qty 1

## 2021-05-07 MED ORDER — NITROGLYCERIN 0.4 MG SL SUBL
0.4000 mg | SUBLINGUAL_TABLET | SUBLINGUAL | 1 refills | Status: DC | PRN
Start: 2021-05-07 — End: 2022-03-21

## 2021-05-07 MED ORDER — METOPROLOL SUCCINATE ER 25 MG PO TB24: 12.5000 mg | ORAL_TABLET | Freq: Every day | ORAL | 3 refills | Status: DC

## 2021-05-07 MED ORDER — ALUM & MAG HYDROXIDE-SIMETH 200-200-20 MG/5ML PO SUSP
30.0000 mL | Freq: Once | ORAL | Status: AC
  Administered 2021-05-07: 30 mL via ORAL
  Filled 2021-05-07: qty 30

## 2021-05-07 MED ORDER — NITROGLYCERIN 0.4 MG SL SUBL: 0.4000 mg | SUBLINGUAL_TABLET | SUBLINGUAL | Status: DC | PRN

## 2021-05-07 MED ORDER — LIDOCAINE VISCOUS HCL 2 % MT SOLN
15.0000 mL | Freq: Once | OROMUCOSAL | Status: AC
Start: 2021-05-07 — End: 2021-05-07
  Administered 2021-05-07: 15 mL via ORAL
  Filled 2021-05-07: qty 15

## 2021-05-07 MED ORDER — COLCHICINE 0.6 MG PO TABS: 0.6000 mg | ORAL_TABLET | Freq: Every day | ORAL | 3 refills | Status: DC

## 2021-05-07 MED ORDER — GADOBUTROL 1 MMOL/ML IV SOLN
12.0000 mL | Freq: Once | INTRAVENOUS | Status: AC | PRN
  Administered 2021-05-07: 12 mL via INTRAVENOUS

## 2021-05-07 NOTE — ED Notes (Signed)
Pt in MRI.

## 2021-05-07 NOTE — ED Notes (Signed)
Pt returned from MRI °

## 2021-05-07 NOTE — ED Notes (Signed)
Patient transported to MRI 
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
## 2021-05-07 NOTE — ED Notes (Signed)
Placed breakfast order ?

## 2021-05-07 NOTE — Addendum Note (Signed)
Addended by: Laverda Page B on: 05/07/2021 01:14 PM ? ? Modules accepted: Orders ? ?

## 2021-05-20 ENCOUNTER — Ambulatory Visit: Payer: Medicaid Other | Admitting: General Practice

## 2021-05-20 ENCOUNTER — Encounter (HOSPITAL_BASED_OUTPATIENT_CLINIC_OR_DEPARTMENT_OTHER): Payer: Self-pay | Admitting: Cardiovascular Disease

## 2021-05-20 ENCOUNTER — Ambulatory Visit (INDEPENDENT_AMBULATORY_CARE_PROVIDER_SITE_OTHER): Payer: Medicaid Other | Admitting: Cardiovascular Disease

## 2021-05-20 VITALS — BP 110/88 | HR 82 | Ht 68.0 in | Wt 219.9 lb

## 2021-05-20 DIAGNOSIS — H348192 Central retinal vein occlusion, unspecified eye, stable: Secondary | ICD-10-CM

## 2021-05-20 DIAGNOSIS — R079 Chest pain, unspecified: Secondary | ICD-10-CM | POA: Diagnosis not present

## 2021-05-20 DIAGNOSIS — I319 Disease of pericardium, unspecified: Secondary | ICD-10-CM

## 2021-05-20 DIAGNOSIS — R55 Syncope and collapse: Secondary | ICD-10-CM | POA: Diagnosis not present

## 2021-05-20 DIAGNOSIS — H348112 Central retinal vein occlusion, right eye, stable: Secondary | ICD-10-CM

## 2021-05-20 HISTORY — DX: Disease of pericardium, unspecified: I31.9

## 2021-05-20 NOTE — Assessment & Plan Note (Addendum)
We will refer to hematology per request of his retina specialist, Dr. Reginal Lutes.  This is reasonable, as there are no other clear etiologies for this event.  There is no evidence of any polycythemia or thrombocytosis.  Coags were normal.  He does not have a history of hypertension. ?
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
## 2021-05-20 NOTE — Patient Instructions (Signed)
Medication Instructions:  ?Your physician recommends that you continue on your current medications as directed. Please refer to the Current Medication list given to you today. ? ?*If you need a refill on your cardiac medications before your next appointment, please call your pharmacy* ? ?Lab Work: ?NONE ? ?Testing/Procedures: ?NONE ? ?Follow-Up: ?At [facility], you and your health needs are our priority.  As part of our continuing mission to provide you with exceptional heart care, we have created designated Provider Care Teams.  These Care Teams include your primary Cardiologist (physician) and Advanced Practice Providers (APPs -  Physician Assistants and Nurse Practitioners) who all work together to provide you with the care you need, when you need it. ? ?We recommend signing up for the patient portal called "MyChart".  Sign up information is provided on this After Visit Summary.  MyChart is used to connect with patients for Virtual Visits (Telemedicine).  Patients are able to view lab/test results, encounter notes, upcoming appointments, etc.  Non-urgent messages can be sent to your provider as well.   ?To learn more about what you can do with MyChart, go to [email or web address].   ? ?Your next appointment:   ?2 month(s) ? ?The format for your next appointment:   ?In Person ? ?Provider:   ?Skeet Latch, MD or Laurann Montana, NP  ? ?You have been referred to HEMATOLOGY  ?IF YOU DO NOT HEAR FROM THE OFFICE YOU CAN CALL THEM DIRECTLY TO FOLLOW UP  ? ?Other Instructions ?MAY RETURN TO WORK IN 2 WEEKS  ? ? ?

## 2021-05-20 NOTE — Assessment & Plan Note (Signed)
Unclear trigger.  He does report having a cold which is most likely the cause.  His medications were reviewed.  I do not see any evidence that his psychiatric medications or the aflibercept would have caused this.  Continue colchicine for a total of 3 months.  Continue metoprolol for now.  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 ?

## 2021-05-20 NOTE — Assessment & Plan Note (Signed)
Occurred in the setting of myopericarditis.  Symptoms have nearly resolved.  Okay for him to resume driving. ?

## 2021-05-21 ENCOUNTER — Telehealth: Payer: Self-pay | Admitting: Physician Assistant

## 2021-05-21 NOTE — Telephone Encounter (Signed)
Scheduled appt per 5/15 referral. Pt is aware of appt date and time. Pt is aware to arrive 15 mins prior to appt time and to bring and updated insurance card. Pt is aware of appt location.   

## 2021-05-22 ENCOUNTER — Encounter (HOSPITAL_BASED_OUTPATIENT_CLINIC_OR_DEPARTMENT_OTHER): Payer: Self-pay | Admitting: Cardiovascular Disease

## 2021-05-30 ENCOUNTER — Telehealth (HOSPITAL_BASED_OUTPATIENT_CLINIC_OR_DEPARTMENT_OTHER): Payer: Self-pay | Admitting: Cardiovascular Disease

## 2021-05-30 NOTE — Telephone Encounter (Signed)
Will forward to Dr [place] and Pharm D for review  

## 2021-05-30 NOTE — Telephone Encounter (Signed)
New Message:    Patient wants to know if he can take this Colon Cleaner(Emma)?

## 2021-05-31 NOTE — Telephone Encounter (Signed)
There are many herbals in the supplement that not much information is known about. A few of them can cause side effects like diarrhea, constipation, gas, and upset stomach (berberine, heartwood), and rise in blood pressure/low potassium (licorice root). Would recommend a GI product that doesn't contain all of these herbals like senna or docusate if something is needed for constipation.

## 2021-06-05 ENCOUNTER — Inpatient Hospital Stay: Payer: Medicaid Other

## 2021-06-05 ENCOUNTER — Inpatient Hospital Stay: Payer: Medicaid Other | Attending: Physician Assistant | Admitting: Physician Assistant

## 2021-06-05 ENCOUNTER — Other Ambulatory Visit: Payer: Self-pay

## 2021-06-05 ENCOUNTER — Encounter: Payer: Self-pay | Admitting: Physician Assistant

## 2021-06-05 VITALS — BP 124/85 | HR 71 | Temp 97.9°F | Wt 223.6 lb

## 2021-06-05 DIAGNOSIS — F419 Anxiety disorder, unspecified: Secondary | ICD-10-CM | POA: Insufficient documentation

## 2021-06-05 DIAGNOSIS — F209 Schizophrenia, unspecified: Secondary | ICD-10-CM | POA: Insufficient documentation

## 2021-06-05 DIAGNOSIS — Z79899 Other long term (current) drug therapy: Secondary | ICD-10-CM | POA: Insufficient documentation

## 2021-06-05 DIAGNOSIS — F32A Depression, unspecified: Secondary | ICD-10-CM | POA: Diagnosis not present

## 2021-06-05 DIAGNOSIS — K219 Gastro-esophageal reflux disease without esophagitis: Secondary | ICD-10-CM | POA: Diagnosis not present

## 2021-06-05 DIAGNOSIS — H3410 Central retinal artery occlusion, unspecified eye: Secondary | ICD-10-CM | POA: Diagnosis present

## 2021-06-05 DIAGNOSIS — H348112 Central retinal vein occlusion, right eye, stable: Secondary | ICD-10-CM

## 2021-06-05 DIAGNOSIS — Z7982 Long term (current) use of aspirin: Secondary | ICD-10-CM | POA: Diagnosis not present

## 2021-06-05 LAB — CBC WITH DIFFERENTIAL (CANCER CENTER ONLY)
Abs Immature Granulocytes: 0.02 10*3/uL (ref 0.00–0.07)
Basophils Absolute: 0 10*3/uL (ref 0.0–0.1)
Basophils Relative: 1 %
Eosinophils Absolute: 0.1 10*3/uL (ref 0.0–0.5)
Eosinophils Relative: 1 %
HCT: 45 % (ref 39.0–52.0)
Hemoglobin: 16.2 g/dL (ref 13.0–17.0)
Immature Granulocytes: 0 %
Lymphocytes Relative: 29 %
Lymphs Abs: 2.2 10*3/uL (ref 0.7–4.0)
MCH: 29 pg (ref 26.0–34.0)
MCHC: 36 g/dL (ref 30.0–36.0)
MCV: 80.6 fL (ref 80.0–100.0)
Monocytes Absolute: 0.5 10*3/uL (ref 0.1–1.0)
Monocytes Relative: 7 %
Neutro Abs: 4.7 10*3/uL (ref 1.7–7.7)
Neutrophils Relative %: 62 %
Platelet Count: 263 10*3/uL (ref 150–400)
RBC: 5.58 MIL/uL (ref 4.22–5.81)
RDW: 14.6 % (ref 11.5–15.5)
WBC Count: 7.5 10*3/uL (ref 4.0–10.5)
nRBC: 0 % (ref 0.0–0.2)

## 2021-06-05 LAB — CMP (CANCER CENTER ONLY)
ALT: 50 U/L — ABNORMAL HIGH (ref 0–44)
AST: 28 U/L (ref 15–41)
Albumin: 5 g/dL (ref 3.5–5.0)
Alkaline Phosphatase: 68 U/L (ref 38–126)
Anion gap: 8 (ref 5–15)
BUN: 18 mg/dL (ref 6–20)
CO2: 27 mmol/L (ref 22–32)
Calcium: 10.3 mg/dL (ref 8.9–10.3)
Chloride: 104 mmol/L (ref 98–111)
Creatinine: 0.85 mg/dL (ref 0.61–1.24)
GFR, Estimated: >60 mL/min (ref >60)
Glucose, Bld: 96 mg/dL (ref 70–99)
Potassium: 4 mmol/L (ref 3.5–5.1)
Sodium: 139 mmol/L (ref 135–145)
Total Bilirubin: 0.6 mg/dL (ref 0.3–1.2)
Total Protein: 8 g/dL (ref 6.5–8.1)

## 2021-06-05 LAB — ANTITHROMBIN III: AntiThromb III Func: 99 % (ref 75–120)
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
## 2021-06-06 ENCOUNTER — Telehealth (HOSPITAL_BASED_OUTPATIENT_CLINIC_OR_DEPARTMENT_OTHER): Payer: Self-pay | Admitting: Cardiovascular Disease

## 2021-06-06 ENCOUNTER — Encounter (HOSPITAL_BASED_OUTPATIENT_CLINIC_OR_DEPARTMENT_OTHER): Payer: Self-pay | Admitting: *Deleted

## 2021-06-06 LAB — BETA-2-GLYCOPROTEIN I ABS, IGG/M/A
Beta-2 Glyco I IgG: <9 GPI IgG units (ref 0–20)
Beta-2-Glycoprotein I IgA: <9 GPI IgA units (ref 0–25)
Beta-2-Glycoprotein I IgM: <9 GPI IgM units (ref 0–32)

## 2021-06-06 LAB — HOMOCYSTEINE: Homocysteine: 12.9 umol/L (ref 0.0–14.5)

## 2021-06-06 LAB — CARDIOLIPIN ANTIBODIES, IGG, IGM, IGA
Anticardiolipin IgA: <9 APL U/mL (ref 0–11)
Anticardiolipin IgG: <9 GPL U/mL (ref 0–14)
Anticardiolipin IgM: <9 MPL U/mL (ref 0–12)

## 2021-06-06 LAB — PROTEIN C, TOTAL: Protein C, Total: 176 % — ABNORMAL HIGH (ref 60–150)

## 2021-06-06 NOTE — Telephone Encounter (Signed)
Please advise 

## 2021-06-06 NOTE — Telephone Encounter (Signed)
Patient calling to see if he can get a renter letter to work from dr. Asking that it be fax to [phone]. Please advise   Also calling about heart monitor

## 2021-06-06 NOTE — Telephone Encounter (Signed)
Patient called to check on status of letter.

## 2021-06-06 NOTE — Telephone Encounter (Signed)
Spoke with patient and he stated he was doing great  Per office visit 5/15 ok to return to work in 2 weeks   Will send letter as requested

## 2021-06-07 LAB — PROTEIN S ACTIVITY: Protein S Activity: 150 % — ABNORMAL HIGH (ref 63–140)

## 2021-06-07 LAB — PROTEIN S, TOTAL: Protein S Ag, Total: 153 % — ABNORMAL HIGH (ref 60–150)

## 2021-06-07 LAB — LUPUS ANTICOAGULANT PANEL
DRVVT: 36.2 s (ref 0.0–47.0)
PTT Lupus Anticoagulant: 36.2 s (ref 0.0–43.5)

## 2021-06-07 LAB — PROTEIN C ACTIVITY: Protein C Activity: >199 % — ABNORMAL HIGH (ref 73–180)

## 2021-06-10 LAB — FACTOR 5 LEIDEN

## 2021-06-11 ENCOUNTER — Encounter: Payer: Self-pay | Admitting: Physician Assistant

## 2021-06-12 LAB — PROTHROMBIN GENE MUTATION

## 2021-06-13 ENCOUNTER — Telehealth: Payer: Self-pay | Admitting: Cardiovascular Disease

## 2021-06-13 ENCOUNTER — Telehealth: Payer: Self-pay | Admitting: Physician Assistant

## 2021-06-13 NOTE — Telephone Encounter (Signed)
Spoke with patient and advised Dr Duke Salvia not in the office but would call him once she has reviewed   Will forward for review

## 2021-06-13 NOTE — Telephone Encounter (Signed)
Patient's calling to find out when he can start exercising again, like riding a bike and the elliptical. He states he is trying to get his weight down.

## 2021-06-13 NOTE — Telephone Encounter (Signed)
I spoke to Mr. Joshua [name] to review the lab results from 06/05/2021. Hypercoagulable workup was negative for clotting disorder. No further hematologic workup is recommended regarding patient's history of retinal vein occlusion. Patient will follow up with his retinal specialist moving forward and return to clinic as needed.

## 2021-06-17 NOTE — Telephone Encounter (Signed)
Dr. Leonides Sake recommendations provided to patient via mychart message,   "OK to start light exercise and gradually work his way back into it.  If he has chest pain he needs to take it easy.   TCR "

## 2021-07-11 DIAGNOSIS — R0683 Snoring: Secondary | ICD-10-CM | POA: Insufficient documentation

## 2021-07-21 NOTE — Progress Notes (Unsigned)
Office Visit    Patient Name: Yamen Castrogiovanni Date of Encounter: 07/22/2021  PCP:  Kathyrn Lass   [place]  Cardiologist:  Skeet Latch, MD  Advanced Practice Provider:  No care team member to display Electrophysiologist:  None      Chief Complaint    Cayden Granholm is a 41 y.o. male presents today for f/u of myopericarditis   Past Medical History    Past Medical History:  Diagnosis Date   Anxiety and depression    Central retinal vein occlusion of right eye    GERD (gastroesophageal reflux disease)    Migraines    Myopericarditis 05/20/2021   Schizophrenia ([place])    Syncopal episodes 05/03/2021   Past Surgical History:  Procedure Laterality Date   HERNIA REPAIR  2000    Allergies  No Known Allergies  History of Present Illness    Chrisean Kloth is a 41 y.o. male with a hx of schizophrenia, central retinal vein occlusion, myopericarditis, syncope last seen 05/20/21.  Admitted 05/06/2021 with syncope.  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  Second while driving his car.  Cardiac enzymes elevated.  Cardiac catheterization outside hospital reportedly showed normal coronary arteries.  EKG inferolateral and anterior ST elevations.  Echo at outside hospital unremarkable.  Suspicious for myocarditis.  Cardiac MRI LVEF 52% with mild mid to basal anterolateral and inferolateral hypokinesis.  There is late gadolinium millimeter enhancement consistent with myopericarditis.  He was started on colchicine and metoprolol.  Monitor revealed predominantly normal sinus rhythm, rare PAC/PVC, 6 beat NSVT.  Seen in follow-up 05/20/2021.  Chest pain improved.  He was recommended to continue colchicine for a total of 3 months.  He presents today for follow-up.  Pleasant gentleman who is 4 children who are 12, 10, 6, 16 months.  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  Reports feeling well since last seen.  No  recurrent chest pain, dyspnea, palpitations.  He is planning to increase his activity by riding his mountain bike.  We reviewed ZIO monitor.  EKGs/Labs/Other Studies Reviewed:   The following studies were reviewed today:   Cardiac MRI 05/07/21: FINDINGS: 1. Normal left ventricular size, with LVEDD 58 mm, and LVEDVi 79 mL/m2.   Normal left ventricular thickness, with intraventricular septal thickness of 7 mm, posterior wall thickness of 9 mm, and myocardial mass index of 56 g/m2.   Low normal left ventricular systolic function (LVEF =48%). There is mid and basal lateral hypokinesis (anterolateral and inferolateral).   Left ventricular parametric mapping notable for increase T2 signal in the mid anterior, lateral, inferior, and in the base lateral and inferior distributions (highest 79 ms).   Elevated in the ECV signal in the lateral base and mid (highest signal, 43% mid anterolateral).   There is late gadolinium enhancement in the left ventricular myocardium- there is patchy LGE in the basal inferior, inferolateral, anterolateral, into the mid, lateral distribution. This is not an ischemic pattern of LGE.   2. Normal right ventricular size with RVEDVI 81 mL/m2.   Normal right ventricular thickness.   Low normal right ventricular systolic function (RVEF =18%). There are no regional wall motion abnormalities or aneurysms.   3.  Normal left and right atrial size.   4. Normal size of the aortic root, ascending aorta and pulmonary artery.   5. Valve assessment:   Aortic Valve: Tri-leaflet aortic valve. Qualitatively, there is no significant regurgitation.   Pulmonic Valve:  Qualitatively, there is no significant regurgitation.   Tricuspid Valve: Qualitatively, there is no significant regurgitation.   Mitral Valve: There is no significant regurgitation.   6. Normal pericardium. Trivial pericardial effusion anterior to the right ventricle and posterior to the left  ventricle. No pericardial thickening. No pericardial enhancement.   7. Grossly, no extracardiac findings. Recommended dedicated study if concerned for non-cardiac pathology.   8. Wrap around and cardiac motion artifacts noted. This decreased the sensitivity of volumetric assessments (RV quantification).   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  EKG: No EKG today  Recent Labs: 06/05/2021: ALT 50; BUN 18; Creatinine 0.85; Hemoglobin 16.2; Platelet Count 263; Potassium 4.0; Sodium 139  Recent Lipid Panel No results found for: "CHOL", "TRIG", "HDL", "CHOLHDL", "VLDL", "LDLCALC", "LDLDIRECT"  Home Medications   Current Meds  Medication Sig   ARIPiprazole (ABILIFY) 20 MG tablet Take 20 mg by mouth at bedtime.   cephALEXin (KEFLEX) 500 MG capsule Take 500 mg by mouth 2 (two) times daily.   colchicine 0.6 MG tablet Take 1 tablet (0.6 mg total) by mouth daily.   escitalopram (LEXAPRO) 20 MG tablet Take 20 mg by mouth at bedtime.   esomeprazole (NEXIUM) 20 MG capsule Take 20 mg by mouth every evening.   hydrOXYzine (ATARAX) 25 MG tablet Take 50 mg by mouth every evening.   metoprolol succinate (TOPROL-XL) 25 MG 24 hr tablet Take 0.5 tablets (12.5 mg total) by mouth daily.   neomycin-polymyxin-hydrocortisone (CORTISPORIN) 3.5-10000-1 OTIC suspension SMARTSIG:3-4 Drop(s) Left Ear 4 Times Daily   nitroGLYCERIN (NITROSTAT) 0.4 MG SL tablet Place 1 tablet (0.4 mg total) under the tongue every 5 (five) minutes as needed for chest pain.   NURTEC 75 MG TBDP Take 1 tablet by mouth every other day.   predniSONE (DELTASONE) 20 MG tablet Take 40 mg by mouth daily.   temazepam (RESTORIL) 30 MG capsule Take 30 mg by mouth at bedtime.     Review of Systems      All other systems reviewed and are otherwise negative except as noted above.  Physical Exam    VS:  BP 120/80   Pulse 73   Ht 5' 8"  (1.727 m)   Wt 227 lb (103 kg)   BMI 34.52 kg/m  , BMI Body mass index is  34.52 kg/m.  Wt Readings from Last 3 Encounters:  07/22/21 227 lb (103 kg)  06/05/21 223 lb 9.6 oz (101.4 kg)  05/20/21 219 lb 14.4 oz (99.7 kg)    GEN: Well nourished, well developed, in no acute distress. HEENT: normal. Neck: Supple, no JVD, carotid bruits, or masses. Cardiac: RRR, no murmurs, rubs, or gallops. No clubbing, cyanosis, edema.  Radials/PT 2+ and equal bilaterally.  Respiratory:  Respirations regular and unlabored, clear to auscultation bilaterally. GI: Soft, nontender, nondistended. MS: No deformity or atrophy. Skin: Warm and dry, no rash. Neuro:  Strength and sensation are intact. Psych: Normal affect.  Assessment & Plan    Syncope -occurred in the setting of myopericarditis.  Symptoms have resolved.  Management below.  Myopericarditis -05/06/2020 admission with myopericarditis with syncope.  Unclear trigger though did note having a cold at the time which is likely cause.  Continue colchicine for total of 3 months.  He may discontinue colchicine 08/08/2021 as he will have completed 46-monthcourse.  As ZIO was without significant arrhythmia, may discontinue metoprolol 08/15/2021.  He will contact our office with recurrent chest pain, palpitations.  Snores -evaluated by otolaryngology.  Declined sleep study.  Was interested in oral device which was provided by otolaryngology  Disposition: Follow up in 3 month(s) with Skeet Latch, MD or APP.  Signed, Loel Dubonnet, NP 07/22/2021, 11:21 AM [place]

## 2021-07-22 ENCOUNTER — Ambulatory Visit (INDEPENDENT_AMBULATORY_CARE_PROVIDER_SITE_OTHER): Payer: Medicaid Other | Admitting: Family

## 2021-07-22 ENCOUNTER — Encounter (HOSPITAL_BASED_OUTPATIENT_CLINIC_OR_DEPARTMENT_OTHER): Payer: Self-pay | Admitting: Family

## 2021-07-22 VITALS — BP 120/80 | HR 73 | Ht 68.0 in | Wt 227.0 lb

## 2021-07-22 DIAGNOSIS — Z87898 Personal history of other specified conditions: Secondary | ICD-10-CM | POA: Diagnosis not present

## 2021-07-22 DIAGNOSIS — R0683 Snoring: Secondary | ICD-10-CM

## 2021-07-22 DIAGNOSIS — I319 Disease of pericardium, unspecified: Secondary | ICD-10-CM

## 2021-07-22 NOTE — Patient Instructions (Signed)
Medication Instructions:  Your physician has recommended you make the following change in your medication:    On 08/08/21, STOP Colchicine  On 08/15/21, STOP Metoprolol  If you have chest pain, palpitations - please call our office  *If you need a refill on your cardiac medications before your next appointment, please call your pharmacy*   Lab Work: None today.   Testing/Procedures: None today.   Your monitor showed rare early heart beat which is not of concern. No significant arrhythmia.    Follow-Up: At [facility], you and your health needs are our priority.  As part of our continuing mission to provide you with exceptional heart care, we have created designated Provider Care Teams.  These Care Teams include your primary Cardiologist (physician) and Advanced Practice Providers (APPs -  Physician Assistants and Nurse Practitioners) who all work together to provide you with the care you need, when you need it.  We recommend signing up for the patient portal called "MyChart".  Sign up information is provided on this After Visit Summary.  MyChart is used to connect with patients for Virtual Visits (Telemedicine).  Patients are able to view lab/test results, encounter notes, upcoming appointments, etc.  Non-urgent messages can be sent to your provider as well.   To learn more about what you can do with MyChart, go to [email or web address].    Your next appointment:   3 month(s)  The format for your next appointment:   In Person  Provider:   Chilton Si, MD or Gillian Shields, NP    Other Instructions  Heart Healthy Diet Recommendations: A low-salt diet is recommended. Meats should be grilled, baked, or boiled. Avoid fried foods. Focus on lean protein sources like fish or chicken with vegetables and fruits. The American Heart Association is a Chief Technology Officer!  American Heart Association Diet and Lifeystyle Recommendations   Exercise recommendations: The American Heart  Association recommends 150 minutes of moderate intensity exercise weekly. Try 30 minutes of moderate intensity exercise 4-5 times per week. This could include walking, jogging, or swimming.   Important Information About Sugar

## 2021-08-14 NOTE — Telephone Encounter (Signed)
Patient viewed results with comments in mychart

## 2021-08-30 ENCOUNTER — Emergency Department (HOSPITAL_COMMUNITY): Payer: Medicaid Other

## 2021-08-30 ENCOUNTER — Encounter (HOSPITAL_COMMUNITY): Payer: Self-pay

## 2021-08-30 ENCOUNTER — Other Ambulatory Visit: Payer: Self-pay

## 2021-08-30 ENCOUNTER — Telehealth: Payer: Self-pay | Admitting: Cardiovascular Disease

## 2021-08-30 ENCOUNTER — Emergency Department (HOSPITAL_COMMUNITY)
Admission: EM | Admit: 2021-08-30 | Discharge: 2021-08-31 | Disposition: A | Discharge status: Home / Self Care | Payer: Medicaid Other | Attending: Emergency Medicine | Admitting: Emergency Medicine

## 2021-08-30 DIAGNOSIS — R42 Dizziness and giddiness: Secondary | ICD-10-CM | POA: Diagnosis not present

## 2021-08-30 DIAGNOSIS — R0600 Dyspnea, unspecified: Secondary | ICD-10-CM | POA: Insufficient documentation

## 2021-08-30 DIAGNOSIS — Z7982 Long term (current) use of aspirin: Secondary | ICD-10-CM | POA: Insufficient documentation

## 2021-08-30 DIAGNOSIS — R079 Chest pain, unspecified: Secondary | ICD-10-CM | POA: Diagnosis not present

## 2021-08-30 LAB — CBC
HCT: 45.4 % (ref 39.0–52.0)
Hemoglobin: 15.5 g/dL (ref 13.0–17.0)
MCH: 28.4 pg (ref 26.0–34.0)
MCHC: 34.1 g/dL (ref 30.0–36.0)
MCV: 83.3 fL (ref 80.0–100.0)
Platelets: 336 10*3/uL (ref 150–400)
RBC: 5.45 MIL/uL (ref 4.22–5.81)
RDW: 14.5 % (ref 11.5–15.5)
WBC: 9.3 10*3/uL (ref 4.0–10.5)
nRBC: 0 % (ref 0.0–0.2)

## 2021-08-30 LAB — BASIC METABOLIC PANEL
Anion gap: 9 (ref 5–15)
BUN: 20 mg/dL (ref 6–20)
CO2: 24 mmol/L (ref 22–32)
Calcium: 9.7 mg/dL (ref 8.9–10.3)
Chloride: 106 mmol/L (ref 98–111)
Creatinine, Ser: 1.01 mg/dL (ref 0.61–1.24)
GFR, Estimated: >60 mL/min (ref >60)
Glucose, Bld: 98 mg/dL (ref 70–99)
Potassium: 3.9 mmol/L (ref 3.5–5.1)
Sodium: 139 mmol/L (ref 135–145)

## 2021-08-30 LAB — TROPONIN I (HIGH SENSITIVITY)
Troponin I (High Sensitivity): 30 ng/L — ABNORMAL HIGH (ref <18)
Troponin I (High Sensitivity): 30 ng/L — ABNORMAL HIGH (ref <18)

## 2021-08-30 MED ORDER — KETOROLAC TROMETHAMINE 30 MG/ML IJ SOLN
15.0000 mg | Freq: Once | INTRAMUSCULAR | Status: AC
  Administered 2021-08-30: 15 mg via INTRAVENOUS
  Filled 2021-08-30: qty 1

## 2021-08-30 NOTE — ED Provider Notes (Signed)
[place] EMERGENCY DEPARTMENT Provider Note   CSN: [id number] Arrival date & time: 08/30/21  1400     History {Add pertinent medical, surgical, social history, OB history to HPI:1} Chief Complaint  Patient presents with   Chest Pain    Joshua [name] is a 41 y.o. male.  41 year old male with a recent diagnosis of myocarditis beginning of May.  Was on colchicine metoprolol until a few days ago.  Stopped his medications at the direction of his cardiologist.  Over the last couple days he has had recurrent left-sided chest pain associated lightheadedness and just not feeling right.  May have had a little bit of dyspnea but nothing persistent.  Patient states that he is worried his myocarditis has returned.  No lower extremity swelling.  No other associated symptoms.  Has not take anything for his discomfort.  Currently is only taking an aspirin a day.   Chest Pain      Home Medications Prior to Admission medications   Medication Sig Start Date End Date Taking? Authorizing Provider  ARIPiprazole (ABILIFY) 20 MG tablet Take 20 mg by mouth at bedtime. 05/07/21   [provider]  cephALEXin (KEFLEX) 500 MG capsule Take 500 mg by mouth 2 (two) times daily. 07/20/21   [provider]  colchicine 0.6 MG tablet Take 1 tablet (0.6 mg total) by mouth daily. 05/08/21   Nita Sells, MD  escitalopram (LEXAPRO) 20 MG tablet Take 20 mg by mouth at bedtime.    [provider]  esomeprazole (NEXIUM) 20 MG capsule Take 20 mg by mouth every evening. 02/11/21   [provider]  hydrOXYzine (ATARAX) 25 MG tablet Take 50 mg by mouth every evening. 04/13/21   [provider]  metoprolol succinate (TOPROL-XL) 25 MG 24 hr tablet Take 0.5 tablets (12.5 mg total) by mouth daily. 05/08/21   Nita Sells, MD  neomycin-polymyxin-hydrocortisone (CORTISPORIN) 3.5-10000-1 OTIC suspension SMARTSIG:3-4 Drop(s) Left Ear 4 Times Daily 07/20/21    [provider]  nitroGLYCERIN (NITROSTAT) 0.4 MG SL tablet Place 1 tablet (0.4 mg total) under the tongue every 5 (five) minutes as needed for chest pain. 05/07/21   Nita Sells, MD  NURTEC 75 MG TBDP Take 1 tablet by mouth every other day. 05/12/21   [provider]  predniSONE (DELTASONE) 20 MG tablet Take 40 mg by mouth daily. 07/20/21   [provider]  temazepam (RESTORIL) 30 MG capsule Take 30 mg by mouth at bedtime. 05/05/21   [provider]      Allergies    Patient has no known allergies.    Review of Systems   Review of Systems  Cardiovascular:  Positive for chest pain.    Physical Exam Updated Vital Signs BP (!) 128/90   Pulse 86   Temp 98.4 F (36.9 C) (Oral)   Resp 15   Ht 5' 8"  (1.727 m)   Wt 99.8 kg   SpO2 96%   BMI 33.45 kg/m  Physical Exam Vitals and nursing note reviewed.  Constitutional:      Appearance: He is well-developed.  HENT:     Head: Normocephalic and atraumatic.  Cardiovascular:     Rate and Rhythm: Normal rate.     Heart sounds: Heart sounds not distant. No murmur heard.    No friction rub.  Pulmonary:     Effort: Pulmonary effort is normal. No tachypnea or respiratory distress.     Breath sounds: No decreased breath sounds.  Abdominal:  General: There is no distension.  Musculoskeletal:        General: Normal range of motion.     Cervical back: Normal range of motion.  Neurological:     Mental Status: He is alert.     ED Results / Procedures / Treatments   Labs (all labs ordered are listed, but only abnormal results are displayed) Labs Reviewed  TROPONIN I (HIGH SENSITIVITY) - Abnormal; Notable for the following components:      Result Value   Troponin I (High Sensitivity) 30 (*)    All other components within normal limits  TROPONIN I (HIGH SENSITIVITY) - Abnormal; Notable for the following components:   Troponin I (High Sensitivity) 30 (*)    All other components within normal  limits  BASIC METABOLIC PANEL  CBC  SEDIMENTATION RATE  C-REACTIVE PROTEIN  BRAIN NATRIURETIC PEPTIDE    EKG EKG Interpretation  Date/Time:  Friday August 30 2021 14:14:09 EDT Ventricular Rate:  101 PR Interval:  142 QRS Duration: 86 QT Interval:  340 QTC Calculation: 440 R Axis:   -7 Text Interpretation: Sinus tachycardia Minimal voltage criteria for LVH, may be normal variant ( R in aVL ) Nonspecific T wave abnormality Abnormal ECG When compared with ECG of 07-May-2021 08:20, PREVIOUS ECG IS PRESENT Confirmed by Merrily Pew [phone]) on 08/30/2021 11:02:21 PM  Radiology DG Chest 1 View  Result Date: 08/30/2021 CLINICAL DATA:  [id number]; chest pain EXAM: CHEST  1 VIEW COMPARISON:  May 06, 2021 FINDINGS: The heart size and mediastinal contours are within normal limits. Both lungs are clear and stable. The visualized skeletal structures are unremarkable. IMPRESSION: No active cardiopulmonary disease. Electronically Signed   By: Frazier Richards M.D.   On: 08/30/2021 14:51    Procedures Procedures    Medications Ordered in ED Medications  ketorolac (TORADOL) 30 MG/ML injection 15 mg (15 mg Intravenous Given 08/30/21 2341)    ED Course/ Medical Decision Making/ A&P                           Medical Decision Making Amount and/or Complexity of Data Reviewed Labs: ordered.  Risk Prescription drug management.   Troponin slightly elevated however nothing near where it was previously.  White blood cell count is within normal limits.  We will add on ESR, CRP and a BNP.  I do not hear any abnormal lung sounds.  On my review of the chest x-ray it appears that his cardiac silhouette is improved from May.  We will wait inflammatory markers and discussed with cardiology.  Likely can restart his metoprolol and just do Mobic until he see cardiology in the office. Doubt ACS. 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   {Document critical care time when appropriate:1} {Document review of labs and  clinical decision tools ie heart score, Chads2Vasc2 etc:1}  {Document your independent review of radiology images, and any outside records:1} {Document your discussion with family members, caretakers, and with consultants:1} {Document social determinants of health affecting pt's care:1} {Document your decision making why or why not admission, treatments were needed:1} Final Clinical Impression(s) / ED Diagnoses Final diagnoses:  None    Rx / DC Orders ED Discharge Orders     None

## 2021-08-30 NOTE — Telephone Encounter (Signed)
Pt c/o of Chest Pain: STAT if CP now or developed within 24 hours  1. Are you having CP right now? Yes.   2. Are you experiencing any other symptoms (ex. SOB, nausea, vomiting, sweating)? No  3. How long have you been experiencing CP? 2 days  4. Is your CP continuous or coming and going? Coming and going   5. Have you taken Nitroglycerin? No.  ?

## 2021-08-30 NOTE — Telephone Encounter (Signed)
Transferred stat call due to patient having chest pain. Chest pain lasting 2 days. He thought it might be anxiety. Chest pain coming and going. RN advised patient to be seen in the ED.    Patient off Metoprolol, Colchicine and anxiety medications.

## 2021-08-30 NOTE — ED Provider Triage Note (Signed)
Emergency Medicine Provider Triage Evaluation Note  Joshua Joshua [name] , Joshua [name] 41 y.o. male  was evaluated in triage.  Pt complains of chest pain onset several weeks. History of cardiac catheterization in May without stents placed.  Denies history of MI.  Cardiologist is Dr. Duke Salvia.  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  Was recently taken off his metoprolol and colchicine that he was being treated for with the myocarditis.  Has noted chest pain for left-sided chest and trouble breathing for the past couple weeks since those medications were discontinued. Has associated shortness of breath, nausea.   Review of Systems  Positive: As per HPI Negative:   Physical Exam  BP (!) 144/95 (BP Location: Right Arm)   Pulse 92   Temp 97.8 F (36.6 C) (Oral)   Resp 18   Ht 5\' 8"  (1.727 m)   Wt 99.8 kg   SpO2 99%   BMI 33.45 kg/m  Gen:   Awake, no distress   Resp:  Normal effort  MSK:   Moves extremities without difficulty  Other:  Left sided chest wall TTP.   Medical Decision Making  Medically screening exam initiated at 2:22 PM.  Appropriate orders placed.  Joshua Joshua [name] was informed that the remainder of the evaluation will be completed by another provider, this initial triage assessment does not replace that evaluation, and the importance of remaining in the ED until their evaluation is complete.  Work-up initiated.   Joshua Shetley A, PA-C 08/30/21 1434

## 2021-08-30 NOTE — ED Triage Notes (Signed)
Pt arrived POV from home c/o left sided CP that has been there for a couple days. Pt endorses SHOB, nausea and not feeling right for a couple days.

## 2021-08-31 LAB — C-REACTIVE PROTEIN: CRP: 1.8 mg/dL — ABNORMAL HIGH (ref <1.0)

## 2021-08-31 LAB — SEDIMENTATION RATE: Sed Rate: 3 mm/hr (ref 0–16)

## 2021-08-31 LAB — BRAIN NATRIURETIC PEPTIDE: B Natriuretic Peptide: 3.8 pg/mL (ref 0.0–100.0)

## 2021-08-31 MED ORDER — METOPROLOL SUCCINATE ER 25 MG PO TB24
25.0000 mg | ORAL_TABLET | ORAL | Status: DC
Start: 2021-08-31 — End: 2021-08-31

## 2021-08-31 MED ORDER — METOPROLOL SUCCINATE ER 25 MG PO TB24
12.5000 mg | ORAL_TABLET | ORAL | Status: AC
  Administered 2021-08-31: 12.5 mg via ORAL

## 2021-08-31 MED ORDER — COLCHICINE 0.6 MG PO TABS: 0.6000 mg | ORAL_TABLET | Freq: Every day | ORAL | 0 refills | Status: DC

## 2021-08-31 MED ORDER — METOPROLOL SUCCINATE ER 25 MG PO TB24
25.0000 mg | ORAL_TABLET | Freq: Every day | ORAL | Status: DC
  Filled 2021-08-31: qty 1

## 2021-08-31 MED ORDER — METOPROLOL SUCCINATE ER 25 MG PO TB24: 12.5000 mg | ORAL_TABLET | Freq: Every day | ORAL | 0 refills | Status: DC

## 2021-08-31 MED ORDER — COLCHICINE 0.6 MG PO TABS
0.6000 mg | ORAL_TABLET | Freq: Once | ORAL | Status: AC
  Administered 2021-08-31: 0.6 mg via ORAL
  Filled 2021-08-31: qty 1

## 2021-09-11 ENCOUNTER — Encounter (HOSPITAL_BASED_OUTPATIENT_CLINIC_OR_DEPARTMENT_OTHER): Payer: Self-pay | Admitting: Family

## 2021-09-11 ENCOUNTER — Ambulatory Visit (INDEPENDENT_AMBULATORY_CARE_PROVIDER_SITE_OTHER): Payer: Medicaid Other | Admitting: Family

## 2021-09-11 VITALS — BP 124/98 | HR 89 | Ht 68.0 in | Wt 231.0 lb

## 2021-09-11 DIAGNOSIS — I319 Disease of pericardium, unspecified: Secondary | ICD-10-CM | POA: Diagnosis not present

## 2021-09-11 DIAGNOSIS — Z87898 Personal history of other specified conditions: Secondary | ICD-10-CM | POA: Diagnosis not present

## 2021-09-11 DIAGNOSIS — R0683 Snoring: Secondary | ICD-10-CM | POA: Diagnosis not present

## 2021-09-11 MED ORDER — METOPROLOL SUCCINATE ER 25 MG PO TB24: 12.5000 mg | ORAL_TABLET | Freq: Every day | ORAL | 3 refills | Status: DC

## 2021-09-11 MED ORDER — COLCHICINE 0.6 MG PO TABS: ORAL_TABLET | ORAL | 0 refills | Status: DC

## 2021-09-11 NOTE — Patient Instructions (Addendum)
Medication Instructions:  Your physician has recommended you make the following change in your medication:   CONTINUE metoprolol half tablet daily  CONTINUE Colchicine 0.6mg  daily for 3 months  After 3 months: Colchicine 0.3mg  (half tablet) daily for 2 weeks Colchicine 0.3mg  (half tablet) every other day for 2 weeks Then discontinue  *If you need a refill on your cardiac medications before your next appointment, please call your pharmacy*  Follow-Up: At [facility], you and your health needs are our priority.  As part of our continuing mission to provide you with exceptional heart care, we have created designated Provider Care Teams.  These Care Teams include your primary Cardiologist (physician) and Advanced Practice Providers (APPs -  Physician Assistants and Nurse Practitioners) who all work together to provide you with the care you need, when you need it.  We recommend signing up for the patient portal called "MyChart".  Sign up information is provided on this After Visit Summary.  MyChart is used to connect with patients for Virtual Visits (Telemedicine).  Patients are able to view lab/test results, encounter notes, upcoming appointments, etc.  Non-urgent messages can be sent to your provider as well.   To learn more about what you can do with MyChart, go to [email or web address].    Your next appointment:   3 month(s)  The format for your next appointment:   In Person  Provider:   Chilton Si, MD or Gillian Shields, NP   Other Instructions  Heart Healthy Diet Recommendations: A low-salt diet is recommended. Meats should be grilled, baked, or boiled. Avoid fried foods. Focus on lean protein sources like fish or chicken with vegetables and fruits. The American Heart Association is a Chief Technology Officer!  American Heart Association Diet and Lifeystyle Recommendations   Exercise recommendations: The American Heart Association recommends 150 minutes of moderate  intensity exercise weekly. Try 30 minutes of moderate intensity exercise 4-5 times per week. This could include walking, jogging, or swimming.

## 2021-09-11 NOTE — Progress Notes (Signed)
Trudee Grip  Office Visit    Patient Name: Joshua [name] Date of Encounter: 09/11/2021  PCP:  [facility]  Cardiologist:  Skeet Latch, MD  Advanced Practice Provider:  No care team member to display Electrophysiologist:  None      Chief Complaint    Joshua [name] is a 41 y.o. male presents today for f/u of myopericarditis   Past Medical History    Past Medical History:  Diagnosis Date   Anxiety and depression    Central retinal vein occlusion of right eye    GERD (gastroesophageal reflux disease)    Migraines    Myopericarditis 05/20/2021   Schizophrenia ([place])    Syncopal episodes 05/03/2021   Past Surgical History:  Procedure Laterality Date   HERNIA REPAIR  2000    Allergies  No Known Allergies  History of Present Illness    Joshua [name] is a 41 y.o. male with a hx of schizophrenia, central retinal vein occlusion, myopericarditis, syncope last seen 05/20/21.  Admitted 05/06/2021 with syncope.  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  Second while driving his car.  Cardiac enzymes elevated.  Cardiac catheterization outside hospital reportedly showed normal coronary arteries.  EKG inferolateral and anterior ST elevations.  Echo at outside hospital unremarkable.  Suspicious for myocarditis.  Cardiac MRI LVEF 52% with mild mid to basal anterolateral and inferolateral hypokinesis.  There is late gadolinium millimeter enhancement consistent with myopericarditis.  He was started on colchicine and metoprolol.  Monitor revealed predominantly normal sinus rhythm, rare PAC/PVC, 6 beat NSVT.  Seen in follow-up 05/20/2021.  Chest pain improved.  He was recommended to continue colchicine for a total of 3 months.  However after discontinuation he re-presented to the ED 08/30/2021 with chest discomfort.  CRP 1.8, sed rate 3, BNP 3.8. HS troponin 30 ? 30 which was improved compared to previous  (05/07/21 HS troponin 2,444). Colchicine and Metoprolol were resumed.   He presents today for follow-up.  Pleasant gentleman who is 4 children who are 12, 10, 6, 16 months.  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 Since resuming Colchicine, Metoprolol no chest discomfort. Tolerating without difficulty. Following heart healthy diet. No formal exercise routine - plans to start using his elliptical at home.  EKGs/Labs/Other Studies Reviewed:   The following studies were reviewed today:   Cardiac MRI 05/07/21: FINDINGS: 1. Normal left ventricular size, with LVEDD 58 mm, and LVEDVi 79 mL/m2.   Normal left ventricular thickness, with intraventricular septal thickness of 7 mm, posterior wall thickness of 9 mm, and myocardial mass index of 56 g/m2.   Low normal left ventricular systolic function (LVEF =13%). There is mid and basal lateral hypokinesis (anterolateral and inferolateral).   Left ventricular parametric mapping notable for increase T2 signal in the mid anterior, lateral, inferior, and in the base lateral and inferior distributions (highest 79 ms).   Elevated in the ECV signal in the lateral base and mid (highest signal, 43% mid anterolateral).   There is late gadolinium enhancement in the left ventricular myocardium- there is patchy LGE in the basal inferior, inferolateral, anterolateral, into the mid, lateral distribution. This is not an ischemic pattern of LGE.   2. Normal right ventricular size with RVEDVI 81 mL/m2.   Normal right ventricular thickness.   Low normal right ventricular systolic function (RVEF =24%). There are no regional wall motion abnormalities  or aneurysms.   3.  Normal left and right atrial size.   4. Normal size of the aortic root, ascending aorta and pulmonary artery.   5. Valve assessment:   Aortic Valve: Tri-leaflet aortic valve. Qualitatively, there is no significant  regurgitation.   Pulmonic Valve: Qualitatively, there is no significant regurgitation.   Tricuspid Valve: Qualitatively, there is no significant regurgitation.   Mitral Valve: There is no significant regurgitation.   6. Normal pericardium. Trivial pericardial effusion anterior to the right ventricle and posterior to the left ventricle. No pericardial thickening. No pericardial enhancement.   7. Grossly, no extracardiac findings. Recommended dedicated study if concerned for non-cardiac pathology.   8. Wrap around and cardiac motion artifacts noted. This decreased the sensitivity of volumetric assessments (RV quantification).   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  EKG: No EKG today  Recent Labs: 06/05/2021: ALT 50 08/30/2021: B Natriuretic Peptide 3.8; BUN 20; Creatinine, Ser 1.01; Hemoglobin 15.5; Platelets 336; Potassium 3.9; Sodium 139  Recent Lipid Panel No results found for: "CHOL", "TRIG", "HDL", "CHOLHDL", "VLDL", "LDLCALC", "LDLDIRECT"  Home Medications   Current Meds  Medication Sig   ARIPiprazole (ABILIFY) 20 MG tablet Take 20 mg by mouth at bedtime.   esomeprazole (NEXIUM) 20 MG capsule Take 20 mg by mouth every evening.   NURTEC 75 MG TBDP Take 1 tablet by mouth every other day.   [DISCONTINUED] colchicine 0.6 MG tablet Take 1 tablet (0.6 mg total) by mouth daily.   [DISCONTINUED] metoprolol succinate (TOPROL-XL) 25 MG 24 hr tablet Take 0.5 tablets (12.5 mg total) by mouth daily.     Review of Systems      All other systems reviewed and are otherwise negative except as noted above.  Physical Exam    VS:  BP (!) 124/98   Pulse 89   Ht 5' 8"  (1.727 m)   Wt 231 lb (104.8 kg)   BMI 35.12 kg/m  , BMI Body mass index is 35.12 kg/m.  Wt Readings from Last 3 Encounters:  09/11/21 231 lb (104.8 kg)  08/30/21 220 lb (99.8 kg)  07/22/21 227 lb (103 kg)    GEN: Well nourished, well developed, in no acute distress. HEENT:  normal. Neck: Supple, no JVD, carotid bruits, or masses. Cardiac: RRR, no murmurs, rubs, or gallops. No clubbing, cyanosis, edema.  Radials/PT 2+ and equal bilaterally.  Respiratory:  Respirations regular and unlabored, clear to auscultation bilaterally. GI: Soft, nontender, nondistended. MS: No deformity or atrophy. Skin: Warm and dry, no rash. Neuro:  Strength and sensation are intact. Psych: Normal affect.  Assessment & Plan    Syncope -occurred in the setting of myopericarditis.  Symptoms have resolved.  Management below.  Myopericarditis -05/06/2021 admission with myopericarditis with syncope.  Unclear trigger though did note having a cold at the time which is likely cause.  C completed 24-monthcourse of colchicine, metoprolol however on discontinuing and recurrent chest discomfort.  Recent ED visit with minimally elevated CRP, troponin elevated but much improved compared to previous.  Will prescribe additional 380-monthourse of colchicine.  After 3 months gradually reduce with 0.3 mg QD x 2 weeks. 0.38m31mOD x 2 weeks, then discontinue. Continue Metoprolol Succinate 12.5mg66m.  Prior ZIO without arrhythmia. He will contact our office with recurrent chest pain, palpitations.  Snores -evaluated by otolaryngology.  Declined sleep study.  Was interested in oral device which was provided by otolaryngology  Disposition: Follow up in 3 month(s) with TiffSkeet Latch or  APP.  Signed, Loel Dubonnet, NP 09/11/2021, 1:22 PM [facility]

## 2021-10-31 ENCOUNTER — Ambulatory Visit (HOSPITAL_BASED_OUTPATIENT_CLINIC_OR_DEPARTMENT_OTHER): Payer: Medicaid Other | Admitting: Cardiovascular Disease

## 2021-11-15 ENCOUNTER — Telehealth: Payer: Self-pay | Admitting: Cardiovascular Disease

## 2021-11-15 NOTE — Telephone Encounter (Signed)
Per C. Walker, NP patient started Colchicine 8/26, take until 11/26 then half tab x2 weeks, then half tablet EOD x2weeks, then stop. Patient notified and verbalizes understanding.

## 2021-11-15 NOTE — Telephone Encounter (Signed)
Pt c/o medication issue:  1. Name of Medication:  metoprolol succinate (TOPROL-XL) 25 MG 24 hr tablet   colchicine 0.6 MG tablet   2. How are you currently taking this medication (dosage and times per day)? Needs to confirm when to stop them  3. Are you having a reaction (difficulty breathing--STAT)? no  4. What is your medication issue? Patient calling to find out when he is supposed to stop the medications. He says he was told to stop them some time this month, but cannot remember when.

## 2021-12-10 DIAGNOSIS — H34811 Central retinal vein occlusion, right eye, with macular edema: Secondary | ICD-10-CM | POA: Diagnosis not present

## 2021-12-10 DIAGNOSIS — H43822 Vitreomacular adhesion, left eye: Secondary | ICD-10-CM | POA: Diagnosis not present

## 2021-12-10 DIAGNOSIS — H35033 Hypertensive retinopathy, bilateral: Secondary | ICD-10-CM | POA: Diagnosis not present

## 2021-12-19 ENCOUNTER — Ambulatory Visit (HOSPITAL_BASED_OUTPATIENT_CLINIC_OR_DEPARTMENT_OTHER): Payer: BC Managed Care – PPO | Admitting: Cardiovascular Disease

## 2021-12-19 ENCOUNTER — Encounter (HOSPITAL_BASED_OUTPATIENT_CLINIC_OR_DEPARTMENT_OTHER): Payer: Self-pay | Admitting: Cardiovascular Disease

## 2021-12-19 VITALS — BP 114/86 | HR 83 | Ht 68.0 in | Wt 230.8 lb

## 2021-12-19 DIAGNOSIS — I401 Isolated myocarditis: Secondary | ICD-10-CM

## 2021-12-19 NOTE — Patient Instructions (Signed)
Medication Instructions:  COMPLETE THE FORMS GIVEN AND TAKE TO YOUR APPOINTMENT WITH PHARM D   *If you need a refill on your cardiac medications before your next appointment, please call your pharmacy*  Lab Work: NONE  Testing/Procedures: NONE  Follow-Up: At [facility], you and your health needs are our priority.  As part of our continuing mission to provide you with exceptional heart care, we have created designated Provider Care Teams.  These Care Teams include your primary Cardiologist (physician) and Advanced Practice Providers (APPs -  Physician Assistants and Nurse Practitioners) who all work together to provide you with the care you need, when you need it.  We recommend signing up for the patient portal called "MyChart".  Sign up information is provided on this After Visit Summary.  MyChart is used to connect with patients for Virtual Visits (Telemedicine).  Patients are able to view lab/test results, encounter notes, upcoming appointments, etc.  Non-urgent messages can be sent to your provider as well.   To learn more about what you can do with MyChart, go to [email or web address].    Your next appointment:   3 month(s)  The format for your next appointment:   In Person  Provider:   Chilton Si, MD or Gillian Shields, NP    You have been referred to [facility] FOR ARCALYST

## 2021-12-19 NOTE — Assessment & Plan Note (Addendum)
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  He did poorly after stopping colchicine and is currently on a taper.  He also continues to be on metoprolol due to chest pain when he stopped colchicine in the past.  We are going to get him started on rinolacept (Arcalyst) for recurrent pericarditis.  He will need a loading dose of 320 mg once followed by 160 mg once per week.  He was given paperwork to help start this process and we will also have him meet with our pharmacist.  Continue to taper colchicine as previously planned.  The patient is interested in trying Arcalyst, a once-a-week injection for recurrent pericarditis, to reduce the risk of future episodes and potentially help him return to a more regular life. He is aware of the potential side effect of cold-like symptoms following the injection.

## 2021-12-19 NOTE — Progress Notes (Signed)
Cardiology Office Note   Date:  12/19/2021   ID:  Cleotilde Neer, DOB 04-Jul-1980, MRN [id number]  PCP:  Joshua Penna, MD  Cardiologist:   Joshua Latch, MD   No chief complaint on file.    History of Present Illness: Joshua [name] is a 41 y.o. male with schizophrenia and central retinal vein occlusion who presents for follow up on myopericarditis.  He was seen in the hospital 05/06/2021 with syncope.  He had 2 episodes of syncope.  The first occurred after getting hot and diaphoretic and while she was in a barbershop.  The second occurred on 05/06/21 while driving his car.  Cardiac enzymes were elevated.  He had a cardiac catheterization in outside hospital that reportedly showed normal coronary arteries.  EKG showed inferolateral and anterior ST elevations echo at the other hospital was unremarkable.  We were suspicious of myocarditis.  He denied any recent respiratory infections.  He also has never been vaccinated for COVID-19 and denied any COVID-19 infections.  Cardiac MRI revealed LVEF 52% with mild mid to basal anterolateral and inferolateral hypokinesis.  There was late gadolinium enhancement consistent with myopericarditis.  He was started on colchicine and metoprolol.  He has a history of central retinal vein occlusion.  The etiology is unclear.  He has a retina specialist, Dr. Vernie [name], who recommended that he be evaluated by hematology.  He has received injections with Eylea and there was concern about whether this may have contributed to his myopericarditis.  At follow-up 05/2021 he was feeling much better.  He saw Joshua Montana, NP 07/2021 and his symptoms had resolved.  He wore an ambulatory monitor and there were no significant arrhythmias.  Metoprolol was discontinued 08/2021.  He followed up with Joshua [name] 09/2021 and noted that his chest pain recurred after stopping metoprolol.  He was seen in the ED with minimally elevated CRP and troponin.  He was given an  additional 3 months of colchicine with plans to gradually taper.  Joshua [name], reports that he is currently taking Colchicine every other day and has not experienced any chest pain lately. He expresses concern about his weight gain, which he attributes to the medication, and is awaiting prioritization for weight loss medication prescribed by Joshua [name], either Saxenda or Orlistat.  Joshua [name] is hesitant to engage in physical activity due to his medical history and weight gain. He is hopeful that the new weight loss medication will help him lose weight and improve his overall health. The patient mentions that he has gained approximately 80 pounds since starting various medications.  Past Medical History:  Diagnosis Date   Anxiety and depression    Central retinal vein occlusion of right eye    GERD (gastroesophageal reflux disease)    Migraines    Myopericarditis 05/20/2021   Schizophrenia ([place])    Syncopal episodes 05/03/2021   Past Surgical History:  Procedure Laterality Date   HERNIA REPAIR  2000   Current Outpatient Medications  Medication Sig Dispense Refill   ARIPiprazole (ABILIFY) 20 MG tablet Take 20 mg by mouth at bedtime.     colchicine 0.6 MG tablet Take 1 tablet (0.6 mg total) by mouth daily for 90 days, THEN 0.5 tablets (0.3 mg total) daily for 14 days, THEN 0.5 tablets (0.3 mg total) every other day for 14 days. 101 tablet 0   escitalopram (LEXAPRO) 20 MG tablet Take 20 mg by mouth daily.     esomeprazole (NEXIUM) 20 MG capsule Take 20 mg  by mouth every evening.     hydrOXYzine (ATARAX) 25 MG tablet Take 50 mg by mouth at bedtime as needed.     metoprolol succinate (TOPROL-XL) 25 MG 24 hr tablet Take 0.5 tablets (12.5 mg total) by mouth daily. 45 tablet 3   nitroGLYCERIN (NITROSTAT) 0.4 MG SL tablet Place 1 tablet (0.4 mg total) under the tongue every 5 (five) minutes as needed for chest pain. 30 tablet 1   rizatriptan (MAXALT-MLT) 10 MG disintegrating tablet Take 10  mg by mouth as needed for migraine.     NURTEC 75 MG TBDP Take 1 tablet by mouth every other day. (Patient not taking: Reported on 12/19/2021)     No current facility-administered medications for this visit.    Allergies:   Patient has no known allergies.    Social History:  The patient  reports that he has never smoked. He has never used smokeless tobacco. He reports that he does not drink alcohol and does not use drugs.   Family History:  The patient's family history includes Seizures in an other family member; Stroke in his maternal aunt.    ROS:  Please see the history of present illness.   Otherwise, review of systems are positive for none.   All other systems are reviewed and negative.    PHYSICAL EXAM: VS:  BP 114/86 (BP Location: Left Arm, Patient Position: Sitting, Cuff Size: Large)   Pulse 83   Ht _0  (1.727 m)   Wt 230 lb 12.8 oz (104.7 kg)   BMI 35.09 kg/m  , BMI Body mass index is 35.09 kg/m. GENERAL:  Well appearing HEENT:  Pupils equal round and reactive, fundi not visualized, oral mucosa unremarkable NECK:  No jugular venous distention, waveform within normal limits, carotid upstroke brisk and symmetric, no bruits, no thyromegaly LUNGS:  Clear to auscultation bilaterally HEART:  RRR.  PMI not displaced or sustained,S1 and S2 within normal limits, no S3, no S4, no clicks, no rubs, no murmurs ABD:  Flat, positive bowel sounds normal in frequency in pitch, no bruits, no rebound, no guarding, no midline pulsatile mass, no hepatomegaly, no splenomegaly EXT:  2 plus pulses throughout, no edema, no cyanosis no clubbing SKIN:  No rashes no nodules NEURO:  Cranial nerves II through XII grossly intact, motor grossly intact throughout PSYCH:  Cognitively intact, oriented to person place and time  EKG:  EKG is ordered today. 05/20/21: sinus rhythm.  Rate 62 bpm.  Inferolateral TWI 12/19/21: Sinus rhythm.  Rate 83 bpm .  Cardiac MRI 05/07/21: FINDINGS: 1. Normal left  ventricular size, with LVEDD 58 mm, and LVEDVi 79 mL/m2.   Normal left ventricular thickness, with intraventricular septal thickness of 7 mm, posterior wall thickness of 9 mm, and myocardial mass index of 56 g/m2.   Low normal left ventricular systolic function (LVEF =17%). There is mid and basal lateral hypokinesis (anterolateral and inferolateral).   Left ventricular parametric mapping notable for increase T2 signal in the mid anterior, lateral, inferior, and in the base lateral and inferior distributions (highest 79 ms).   Elevated in the ECV signal in the lateral base and mid (highest signal, 43% mid anterolateral).   There is late gadolinium enhancement in the left ventricular myocardium- there is patchy LGE in the basal inferior, inferolateral, anterolateral, into the mid, lateral distribution. This is not an ischemic pattern of LGE.   2. Normal right ventricular size with RVEDVI 81 mL/m2.   Normal right ventricular thickness.   Low normal  right ventricular systolic function (RVEF =93%). There are no regional wall motion abnormalities or aneurysms.   3.  Normal left and right atrial size.   4. Normal size of the aortic root, ascending aorta and pulmonary artery.   5. Valve assessment:   Aortic Valve: Tri-leaflet aortic valve. Qualitatively, there is no significant regurgitation.   Pulmonic Valve: Qualitatively, there is no significant regurgitation.   Tricuspid Valve: Qualitatively, there is no significant regurgitation.   Mitral Valve: There is no significant regurgitation.   6. Normal pericardium. Trivial pericardial effusion anterior to the right ventricle and posterior to the left ventricle. No pericardial thickening. No pericardial enhancement.   7. Grossly, no extracardiac findings. Recommended dedicated study if concerned for non-cardiac pathology.   8. Wrap around and cardiac motion artifacts noted. This decreased the sensitivity of volumetric  assessments (RV quantification).   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  He did poorly after stopping colchicine and is currently on a taper.  He also continues to be on metoprolol due to chest pain when he stopped colchicine in the past.  We are going to get him started on rinolacept (Arcalyst) for recurrent pericarditis.  He will need a loading dose of 320 mg once followed by 160 mg once per week.  He was given paperwork to help start this process and we will also have him meet with our pharmacist.  Continue to taper colchicine as previously planned.  The patient is interested in trying Arcalyst, a once-a-week injection for recurrent pericarditis, to reduce the risk of future episodes and potentially help him return to a more regular life. He is aware of the potential side effect of cold-like symptoms following the injection.   Current medicines are reviewed at length with the patient today.  The patient does not have concerns regarding medicines.  The following changes have been made:  no change  Labs/ tests ordered today include:   Orders Placed This Encounter  Procedures   AMB Referral to [facility] Pharm[facility]D   EKG 12-Lead     Disposition:   FU with Rita Vialpando C. Oval Linsey, MD, [facility] in 2 months.     Signed, Salene Mohamud C. Oval Linsey, MD, [facility]  12/19/2021 11:10 AM    [place]

## 2022-01-03 ENCOUNTER — Ambulatory Visit (HOSPITAL_BASED_OUTPATIENT_CLINIC_OR_DEPARTMENT_OTHER): Payer: Medicaid Other | Admitting: Cardiovascular Disease

## 2022-01-10 DIAGNOSIS — H34811 Central retinal vein occlusion, right eye, with macular edema: Secondary | ICD-10-CM | POA: Diagnosis not present

## 2022-01-16 DIAGNOSIS — J22 Unspecified acute lower respiratory infection: Secondary | ICD-10-CM | POA: Diagnosis not present

## 2022-01-16 DIAGNOSIS — R052 Subacute cough: Secondary | ICD-10-CM | POA: Diagnosis not present

## 2022-01-16 DIAGNOSIS — R062 Wheezing: Secondary | ICD-10-CM | POA: Diagnosis not present

## 2022-01-17 ENCOUNTER — Ambulatory Visit (HOSPITAL_BASED_OUTPATIENT_CLINIC_OR_DEPARTMENT_OTHER): Payer: BC Managed Care – PPO | Admitting: Pharmacist Clinician (PhC)/ Clinical Pharmacy Specialist

## 2022-01-17 DIAGNOSIS — I319 Disease of pericardium, unspecified: Secondary | ICD-10-CM

## 2022-01-17 NOTE — Progress Notes (Signed)
Office Visit    Patient Name: Joshua [name] Date of Encounter: 01/18/2022  Primary Care Provider:  Vickii Penna, MD Primary Cardiologist:  Skeet Latch, MD  Chief Complaint    Acute Idiopathic myocarditis  Significant Past Medical History                    No Known Allergies  History of Present Illness    Joshua [name] is a 42 y.o. male patient of Dr Oval Linsey, with idiopathic myocarditis.  Patient had 2 syncopal episodes in April 2023 and we seen in an outside hospital.  Cardiac enzymes were elevated, but cath showed normal arteries.  He denied any recent respiratory infections. He also has never been vaccinated for COVID-19 and denied any COVID-19 infections. Cardiac MRI revealed LVEF 52% with mild mid to basal anterolateral and inferolateral hypokinesis. There was late gadolinium enhancement consistent with myopericarditis. He was started on colchicine and metoprolol.   At follow in August he was doing well and the metoprolol was discontinued, however the next month he noted a return of chest pain.  ED visit showed minimally elevated CRP and troponin.  Colchicine was re-started for additional 3 months with a gradual taper.  He took his last dose on Christmas Eve.   Today he is in the office to discuss Arcolyst.  He is concerned about possible cost, as he is currently taking Zepbound for which he pays several hundred dollars per month.  He reports some nausea, but has lost 10 pounds in the 3 weeks since he started.    Blood Pressure Goal:  130/80  Current Medications:  none  Previously tried:  colchicine  Family Hx:   mother had cancer, on radiation, otherwise healthy, dad healthy, no heart issues (71, 64),3 siblings all healthy, mgm had stroke  Social Hx:      Tobacco: no  Alcohol:no  Caffeine: coffee home brewed occasionally [employer], has cut out since Zepbound  Diet:    trying to cut back on size of meals since starting Zepbound, now fewer  carbohydrates and more protein  Exercise: was walking, needs to get back    Accessory Clinical Findings    Lab Results  Component Value Date   CREATININE 1.01 08/30/2021   BUN 20 08/30/2021   NA 139 08/30/2021   K 3.9 08/30/2021   CL 106 08/30/2021   CO2 24 08/30/2021   Lab Results  Component Value Date   ALT 50 (H) 06/05/2021   AST 28 06/05/2021   ALKPHOS 68 06/05/2021   BILITOT 0.6 06/05/2021   No results found for: "HGBA1C"  Home Medications    Current Outpatient Medications  Medication Sig Dispense Refill   ARIPiprazole (ABILIFY) 20 MG tablet Take 20 mg by mouth at bedtime.     colchicine 0.6 MG tablet Take 1 tablet (0.6 mg total) by mouth daily for 90 days, THEN 0.5 tablets (0.3 mg total) daily for 14 days, THEN 0.5 tablets (0.3 mg total) every other day for 14 days. 101 tablet 0   escitalopram (LEXAPRO) 20 MG tablet Take 20 mg by mouth daily.     esomeprazole (NEXIUM) 20 MG capsule Take 20 mg by mouth every evening.     hydrOXYzine (ATARAX) 25 MG tablet Take 50 mg by mouth at bedtime as needed.     metoprolol succinate (TOPROL-XL) 25 MG 24 hr tablet Take 0.5 tablets (12.5 mg total) by mouth daily. 45 tablet 3   nitroGLYCERIN (NITROSTAT) 0.4 MG SL tablet Place  1 tablet (0.4 mg total) under the tongue every 5 (five) minutes as needed for chest pain. 30 tablet 1   NURTEC 75 MG TBDP Take 1 tablet by mouth every other day. (Patient not taking: Reported on 12/19/2021)     rizatriptan (MAXALT-MLT) 10 MG disintegrating tablet Take 10 mg by mouth as needed for migraine.     No current facility-administered medications for this visit.     Assessment & Plan    Myopericarditis Patient with two episodes of myocarditis in 2023.  Reviewed information about new medication Arcalyst.  Had patient fill out assistance paperwork due to high cost of medication.  We will submit this to [facility] to see if he would qualify for free or low cost on this.  Reviewed injection  information.  Once medication approved he will do one injection weekly.  Answered all patient questions.    Tommy Medal PharmD CPP [place]  [address] [place], [place] [address] [phone]

## 2022-01-17 NOTE — Patient Instructions (Addendum)
We will start the process to get Arcalyst covered by your insurance company.  You may hear from them in the next few weeks.  The company that makes the [place] is Leanna Sato and their program is called [employer].  If you have any further questions or concerns, please reach out to me Tommy Medal) though your MyChart account.  If you change insurance plans, please let us know as soon as you have the new insurance information.

## 2022-01-18 ENCOUNTER — Encounter (HOSPITAL_BASED_OUTPATIENT_CLINIC_OR_DEPARTMENT_OTHER): Payer: Self-pay | Admitting: Pharmacist Clinician (PhC)/ Clinical Pharmacy Specialist

## 2022-01-18 NOTE — Assessment & Plan Note (Signed)
Patient with two episodes of myocarditis in 2023.  Reviewed information about new medication Arcalyst.  Had patient fill out assistance paperwork due to high cost of medication.  We will submit this to [facility] to see if he would qualify for free or low cost on this.  Reviewed injection information.  Once medication approved he will do one injection weekly.  Answered all patient questions.

## 2022-01-21 ENCOUNTER — Telehealth: Payer: Self-pay | Admitting: Pharmacist Clinician (PhC)/ Clinical Pharmacy Specialist

## 2022-01-21 DIAGNOSIS — I319 Disease of pericardium, unspecified: Secondary | ICD-10-CM

## 2022-01-21 NOTE — Telephone Encounter (Signed)
Vicky pharm tech with [facility] called in stating they needs a prescription for Arcolys as well as ICD10 diagnosis codes, allergy, and medication list   Fax: [phone]

## 2022-01-27 DIAGNOSIS — Z79899 Other long term (current) drug therapy: Secondary | ICD-10-CM | POA: Diagnosis not present

## 2022-01-27 DIAGNOSIS — F200 Paranoid schizophrenia: Secondary | ICD-10-CM | POA: Diagnosis not present

## 2022-01-27 DIAGNOSIS — F5101 Primary insomnia: Secondary | ICD-10-CM | POA: Diagnosis not present

## 2022-01-27 DIAGNOSIS — F419 Anxiety disorder, unspecified: Secondary | ICD-10-CM | POA: Diagnosis not present

## 2022-01-27 DIAGNOSIS — Z0000 Encounter for general adult medical examination without abnormal findings: Secondary | ICD-10-CM | POA: Diagnosis not present

## 2022-01-27 DIAGNOSIS — E221 Hyperprolactinemia: Secondary | ICD-10-CM | POA: Diagnosis not present

## 2022-01-27 DIAGNOSIS — G43009 Migraine without aura, not intractable, without status migrainosus: Secondary | ICD-10-CM | POA: Insufficient documentation

## 2022-01-31 NOTE — Telephone Encounter (Signed)
Called and spoke with Richardson Landry the pharmacist at [facility] [phone]). Authorized loading dose of Arcolysts (two 160mg  injections along with sterile water and syringes) along with 4 maintenance doses.

## 2022-02-19 ENCOUNTER — Telehealth: Payer: Self-pay | Admitting: Pharmacist Clinician (PhC)/ Clinical Pharmacy Specialist

## 2022-02-19 NOTE — Telephone Encounter (Signed)
Left message to see if patient was having issues with getting Arcolyst.    He called back to say he had decided not to pursue the medication at this time.

## 2022-02-21 DIAGNOSIS — H34811 Central retinal vein occlusion, right eye, with macular edema: Secondary | ICD-10-CM | POA: Diagnosis not present

## 2022-02-21 DIAGNOSIS — Z79899 Other long term (current) drug therapy: Secondary | ICD-10-CM | POA: Diagnosis not present

## 2022-02-21 DIAGNOSIS — G43909 Migraine, unspecified, not intractable, without status migrainosus: Secondary | ICD-10-CM | POA: Diagnosis not present

## 2022-02-21 DIAGNOSIS — F200 Paranoid schizophrenia: Secondary | ICD-10-CM | POA: Diagnosis not present

## 2022-03-21 ENCOUNTER — Encounter (HOSPITAL_BASED_OUTPATIENT_CLINIC_OR_DEPARTMENT_OTHER): Payer: Self-pay | Admitting: Family

## 2022-03-21 ENCOUNTER — Ambulatory Visit (INDEPENDENT_AMBULATORY_CARE_PROVIDER_SITE_OTHER): Payer: 59 | Admitting: Family

## 2022-03-21 VITALS — BP 123/86 | HR 97 | Ht 68.0 in | Wt 212.0 lb

## 2022-03-21 DIAGNOSIS — R0683 Snoring: Secondary | ICD-10-CM | POA: Diagnosis not present

## 2022-03-21 DIAGNOSIS — Z87898 Personal history of other specified conditions: Secondary | ICD-10-CM

## 2022-03-21 DIAGNOSIS — I319 Disease of pericardium, unspecified: Secondary | ICD-10-CM | POA: Diagnosis not present

## 2022-03-21 MED ORDER — NITROGLYCERIN 0.4 MG SL SUBL: 0.4000 mg | SUBLINGUAL_TABLET | SUBLINGUAL | 2 refills | Status: AC | PRN

## 2022-03-21 NOTE — Patient Instructions (Signed)
Medication Instructions:  Your physician recommends that you continue on your current medications as directed. Please refer to the Current Medication list given to you today.  We have refilled your nitro today! *If you need a refill on your cardiac medications before your next appointment, please call your pharmacy*   Lab Work: Your physician recommends that you return for lab work today- CRP and Sed Rate   Follow-Up: At [facility], you and your health needs are our priority.  As part of our continuing mission to provide you with exceptional heart care, we have created designated Provider Care Teams.  These Care Teams include your primary Cardiologist (physician) and Advanced Practice Providers (APPs -  Physician Assistants and Nurse Practitioners) who all work together to provide you with the care you need, when you need it.  We recommend signing up for the patient portal called "MyChart".  Sign up information is provided on this After Visit Summary.  MyChart is used to connect with patients for Virtual Visits (Telemedicine).  Patients are able to view lab/test results, encounter notes, upcoming appointments, etc.  Non-urgent messages can be sent to your provider as well.   To learn more about what you can do with MyChart, go to [email or web address].    Your next appointment:   6-9 month(s)  Provider:   Skeet Latch, MD or Laurann Montana, NP

## 2022-03-21 NOTE — Progress Notes (Signed)
Joshua [name]  Office Visit    Patient Name: Joshua [name] Date of Encounter: 03/21/2022  PCP:  Vickii Penna, MD   [place]  Cardiologist:  Skeet Latch, MD  Advanced Practice Provider:  No care team member to display Electrophysiologist:  None      Chief Complaint    Joshua [name] is a 42 y.o. male presents today for f/u of myopericarditis   Past Medical History    Past Medical History:  Diagnosis Date   Anxiety and depression    Central retinal vein occlusion of right eye    GERD (gastroesophageal reflux disease)    Migraines    Myopericarditis 05/20/2021   Schizophrenia ([place])    Syncopal episodes 05/03/2021   Past Surgical History:  Procedure Laterality Date   HERNIA REPAIR  2000    Allergies  No Known Allergies  History of Present Illness    Joshua [name] is a 42 y.o. male with a hx of schizophrenia, central retinal vein occlusion, myopericarditis, syncope last seen 01/17/2022.  Admitted 05/06/2021 with syncope.  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  Second while driving his car.  Cardiac enzymes elevated.  Cardiac catheterization outside hospital reportedly showed normal coronary arteries.  EKG inferolateral and anterior ST elevations.  Echo at outside hospital unremarkable.  Suspicious for myocarditis.  Cardiac MRI LVEF 52% with mild mid to basal anterolateral and inferolateral hypokinesis.  There was late gadolinium millimeter enhancement consistent with myopericarditis.  He was started on colchicine and metoprolol.  Monitor revealed predominantly normal sinus rhythm, rare PAC/PVC, 6 beat NSVT.  Seen in follow-up 05/20/2021.  Chest pain improved.  He was recommended to continue colchicine for a total of 3 months.  However after discontinuation he re-presented to the ED 08/30/2021 with chest discomfort.  CRP 1.8, sed rate 3, BNP 3.8. HS troponin 30 ? 30 which was improved compared to previous  (05/07/21 HS troponin 2,444). Colchicine and Metoprolol were resumed.  At follow-up 09/2021 colchicine taper planned. Seen 12/19/21 by Dr. Oval Linsey taking colchicine every other day with no chest pain. Saw pharmacy team 01/17/22 to discuss [place]. Talked with pharmacy team 02/19/22 and opted not to pursue Arcolyst.   Presents today for follow up. Weight down nearly 20 pounds over the last 3 months.  Tells me he did not tolerate stepdown due to nausea.  He has been working physical labor job for exercise and plans to resume some cardio with bike riding.  He has been following a low-carb diet and is pleased with his weight loss.  He has been able to discontinue colchicine and metoprolol for greater than 1 month with no recurrent chest pain, pressure, tightness.  He prefers to avoid additional medications if possible and verbalizes appreciation of the pharmacy team for getting him approved for [place] but wishes to continue monitoring as no chest pain.   EKGs/Labs/Other Studies Reviewed:   The following studies were reviewed today:   Cardiac MRI 05/07/21: FINDINGS: 1. Normal left ventricular size, with LVEDD 58 mm, and LVEDVi 79 mL/m2.   Normal left ventricular thickness, with intraventricular septal thickness of 7 mm, posterior wall thickness of 9 mm, and myocardial mass index of 56 g/m2.   Low normal left ventricular systolic function (LVEF [id number]). There is mid and basal lateral hypokinesis (anterolateral and inferolateral).   Left ventricular parametric mapping notable for increase T2 signal in the mid anterior, lateral, inferior, and in the base lateral and inferior distributions (highest 79 ms).  Elevated in the ECV signal in the lateral base and mid (highest signal, 43% mid anterolateral).   There is late gadolinium enhancement in the left ventricular myocardium- there is patchy LGE in the basal inferior, inferolateral, anterolateral, into the mid, lateral distribution. This is not an  ischemic pattern of LGE.   2. Normal right ventricular size with RVEDVI 81 mL/m2.   Normal right ventricular thickness.   Low normal right ventricular systolic function (RVEF [id number]). There are no regional wall motion abnormalities or aneurysms.   3.  Normal left and right atrial size.   4. Normal size of the aortic root, ascending aorta and pulmonary artery.   5. Valve assessment:   Aortic Valve: Tri-leaflet aortic valve. Qualitatively, there is no significant regurgitation.   Pulmonic Valve: Qualitatively, there is no significant regurgitation.   Tricuspid Valve: Qualitatively, there is no significant regurgitation.   Mitral Valve: There is no significant regurgitation.   6. Normal pericardium. Trivial pericardial effusion anterior to the right ventricle and posterior to the left ventricle. No pericardial thickening. No pericardial enhancement.   7. Grossly, no extracardiac findings. Recommended dedicated study if concerned for non-cardiac pathology.   8. Wrap around and cardiac motion artifacts noted. This decreased the sensitivity of volumetric assessments (RV quantification).   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  EKG: No EKG today  Recent Labs: 06/05/2021: ALT 50 08/30/2021: B Natriuretic Peptide 3.8; BUN 20; Creatinine, Ser 1.01; Hemoglobin 15.5; Platelets 336; Potassium 3.9; Sodium 139  Recent Lipid Panel No results found for: "CHOL", "TRIG", "HDL", "CHOLHDL", "VLDL", "LDLCALC", "LDLDIRECT"  Home Medications   Current Meds  Medication Sig   esomeprazole (NEXIUM) 20 MG capsule Take 20 mg by mouth every evening.   nitroGLYCERIN (NITROSTAT) 0.4 MG SL tablet Place 1 tablet (0.4 mg total) under the tongue every 5 (five) minutes as needed for chest pain.   NURTEC 75 MG TBDP Take 1 tablet by mouth every other day.   rizatriptan (MAXALT-MLT) 10 MG disintegrating tablet Take 10 mg by mouth as needed for migraine.     Review of Systems       All other systems reviewed and are otherwise negative except as noted above.  Physical Exam    VS:  BP 123/86   Pulse 97   Ht 5\' 8"  (1.727 m)   Wt 212 lb (96.2 kg)   BMI 32.23 kg/m  , BMI Body mass index is 32.23 kg/m.  Wt Readings from Last 3 Encounters:  03/21/22 212 lb (96.2 kg)  12/19/21 230 lb 12.8 oz (104.7 kg)  09/11/21 231 lb (104.8 kg)    GEN: Well nourished, well developed, in no acute distress. HEENT: normal. Neck: Supple, no JVD, carotid bruits, or masses. Cardiac: RRR, no murmurs, rubs, or gallops. No clubbing, cyanosis, edema.  Radials/PT 2+ and equal bilaterally.  Respiratory:  Respirations regular and unlabored, clear to auscultation bilaterally. GI: Soft, nontender, nondistended. MS: No deformity or atrophy. Skin: Warm and dry, no rash. Neuro:  Strength and sensation are intact. Psych: Normal affect.  Assessment & Plan    Syncope -occurred in the setting of myopericarditis.  No recurrence.  Myopericarditis -05/06/2021 admission with myopericarditis with syncope.  Unclear trigger though did note having a cold at the time which is likely cause. Repeat ED visit 08/2021. Required 6 mos of colchicine. Has been off colchicine/metoprolol for >1 month with no recurrent chest pain. Previously approved for Arcolyst but as no recurrent chest pain prefers to avoid additional agents  which is reasonable.   Snores -evaluated by otolaryngology.  Declined sleep study.  Oral device which was provided by otolaryngology  Disposition: Follow up in 6-9 month(s) with Skeet Latch, MD or APP.  Signed, Loel Dubonnet, NP 03/21/2022, 9:10 AM [place]

## 2022-03-22 ENCOUNTER — Encounter (HOSPITAL_BASED_OUTPATIENT_CLINIC_OR_DEPARTMENT_OTHER): Payer: Self-pay

## 2022-03-22 LAB — C-REACTIVE PROTEIN: CRP: 3 mg/L (ref 0–10)

## 2022-03-22 LAB — SEDIMENTATION RATE: Sed Rate: 2 mm/hr (ref 0–15)

## 2022-04-19 ENCOUNTER — Emergency Department (HOSPITAL_COMMUNITY): Payer: 59

## 2022-04-19 ENCOUNTER — Emergency Department (HOSPITAL_COMMUNITY)
Admission: EM | Admit: 2022-04-19 | Discharge: 2022-04-19 | Disposition: A | Discharge status: Home / Self Care | Payer: 59 | Attending: Emergency Medicine | Admitting: Emergency Medicine

## 2022-04-19 ENCOUNTER — Other Ambulatory Visit: Payer: Self-pay

## 2022-04-19 ENCOUNTER — Encounter (HOSPITAL_COMMUNITY): Payer: Self-pay

## 2022-04-19 DIAGNOSIS — U071 COVID-19: Secondary | ICD-10-CM | POA: Diagnosis not present

## 2022-04-19 DIAGNOSIS — R079 Chest pain, unspecified: Secondary | ICD-10-CM

## 2022-04-19 DIAGNOSIS — R0602 Shortness of breath: Secondary | ICD-10-CM | POA: Diagnosis not present

## 2022-04-19 DIAGNOSIS — R0789 Other chest pain: Secondary | ICD-10-CM | POA: Diagnosis not present

## 2022-04-19 LAB — TROPONIN I (HIGH SENSITIVITY)
Troponin I (High Sensitivity): 13 ng/L (ref <18)
Troponin I (High Sensitivity): 13 ng/L (ref <18)

## 2022-04-19 LAB — CBC
HCT: 44.6 % (ref 39.0–52.0)
Hemoglobin: 16 g/dL (ref 13.0–17.0)
MCH: 29.5 pg (ref 26.0–34.0)
MCHC: 35.9 g/dL (ref 30.0–36.0)
MCV: 82.3 fL (ref 80.0–100.0)
Platelets: 346 10*3/uL (ref 150–400)
RBC: 5.42 MIL/uL (ref 4.22–5.81)
RDW: 14.4 % (ref 11.5–15.5)
WBC: 11.1 10*3/uL — ABNORMAL HIGH (ref 4.0–10.5)
nRBC: 0 % (ref 0.0–0.2)

## 2022-04-19 LAB — BASIC METABOLIC PANEL
Anion gap: 13 (ref 5–15)
BUN: 13 mg/dL (ref 6–20)
CO2: 21 mmol/L — ABNORMAL LOW (ref 22–32)
Calcium: 9.7 mg/dL (ref 8.9–10.3)
Chloride: 106 mmol/L (ref 98–111)
Creatinine, Ser: 0.84 mg/dL (ref 0.61–1.24)
GFR, Estimated: >60 mL/min (ref >60)
Glucose, Bld: 135 mg/dL — ABNORMAL HIGH (ref 70–99)
Potassium: 3.9 mmol/L (ref 3.5–5.1)
Sodium: 140 mmol/L (ref 135–145)

## 2022-04-19 MED ORDER — IOHEXOL 350 MG/ML SOLN
75.0000 mL | Freq: Once | INTRAVENOUS | Status: AC | PRN
  Administered 2022-04-19: 75 mL via INTRAVENOUS

## 2022-04-19 MED ORDER — CELECOXIB 200 MG PO CAPS: 200.0000 mg | ORAL_CAPSULE | Freq: Two times a day (BID) | ORAL | 0 refills | Status: DC

## 2022-04-19 MED ORDER — FAMOTIDINE 20 MG PO TABS: 20.0000 mg | ORAL_TABLET | Freq: Two times a day (BID) | ORAL | 0 refills | Status: DC

## 2022-04-19 NOTE — ED Provider Notes (Signed)
[place] EMERGENCY DEPARTMENT AT [facility] Provider Note   CSN: [id number] Arrival date & time: 04/19/22  4098    History  Chief Complaint  Patient presents with   Chest Pain    Joshua [name] is a 42 y.o. male with past history of myocarditis followed by cards here for evaluation of CP. Intermittent sharp pain to left chest.  Does not radiate to back, arm.  No associated diaphoresis.  Non exertional. Some SOB. Dx with COVID 3/31. Fatigued currently. No LE swelling. No hx of PE, DVT. Pain not worse with position. No fever. Stopped colchicine and Metoprolol 1 month ago for prior myocarditis.  Dr. Duke Salvia-- Cards  HPI     Home Medications Prior to Admission medications   Medication Sig Start Date End Date Taking? Authorizing Provider  celecoxib (CELEBREX) 200 MG capsule Take 1 capsule (200 mg total) by mouth 2 (two) times daily for 10 days. 04/19/22 04/29/22 Yes Kinslie Hove A, PA-C  famotidine (PEPCID) 20 MG tablet Take 1 tablet (20 mg total) by mouth 2 (two) times daily. 04/19/22  Yes Allenmichael Mcpartlin A, PA-C  esomeprazole (NEXIUM) 20 MG capsule Take 20 mg by mouth every evening. 02/11/21   [provider]  nitroGLYCERIN (NITROSTAT) 0.4 MG SL tablet Place 1 tablet (0.4 mg total) under the tongue every 5 (five) minutes as needed for chest pain. 03/21/22   Alver Sorrow, NP  NURTEC 75 MG TBDP Take 1 tablet by mouth every other day. 05/12/21   [provider]  rizatriptan (MAXALT-MLT) 10 MG disintegrating tablet Take 10 mg by mouth as needed for migraine. 12/12/21   [provider]      Allergies    Patient has no known allergies.    Review of Systems   Review of Systems  Constitutional: Negative.   HENT: Negative.    Respiratory:  Positive for shortness of breath.   Cardiovascular:  Positive for chest pain. Negative for palpitations and leg swelling.  Gastrointestinal: Negative.   Genitourinary: Negative.   Musculoskeletal:  Negative.   Skin: Negative.   Neurological: Negative.   All other systems reviewed and are negative.   Physical Exam Updated Vital Signs BP 111/76   Pulse 81   Temp 98.4 F (36.9 C) (Oral)   Resp 18   Ht  (1.727 m)   Wt 97.5 kg   SpO2 94%   BMI 32.69 kg/m  Physical Exam Vitals and nursing note reviewed.  Constitutional:      General: He is not in acute distress.    Appearance: He is well-developed. He is not ill-appearing, toxic-appearing or diaphoretic.  HENT:     Head: Normocephalic and atraumatic.  Eyes:     Pupils: Pupils are equal, round, and reactive to light.  Cardiovascular:     Rate and Rhythm: Normal rate and regular rhythm.     Pulses: Normal pulses.          Radial pulses are 2+ on the right side and 2+ on the left side.       Dorsalis pedis pulses are 2+ on the right side and 2+ on the left side.     Heart sounds: Normal heart sounds.     Comments: Mild anterior chest wall tenderness without crepitus or step-off Pulmonary:     Effort: Pulmonary effort is normal. No respiratory distress.     Breath sounds: Normal breath sounds.  Chest:     Chest wall: No mass, tenderness or edema. There  is no dullness to percussion.  Abdominal:     General: Bowel sounds are normal. There is no distension.     Palpations: Abdomen is soft.  Musculoskeletal:        General: Normal range of motion.     Cervical back: Normal range of motion and neck supple.     Right lower leg: No tenderness. No edema.     Left lower leg: No tenderness. No edema.     Comments: No bony tenderness, compartments soft  Skin:    General: Skin is warm and dry.     Capillary Refill: Capillary refill takes less than 2 seconds.  Neurological:     General: No focal deficit present.     Mental Status: He is alert and oriented to person, place, and time.    ED Results / Procedures / Treatments   Labs (all labs ordered are listed, but only abnormal results are displayed) Labs Reviewed  BASIC  METABOLIC PANEL - Abnormal; Notable for the following components:      Result Value   CO2 21 (*)    Glucose, Bld 135 (*)    All other components within normal limits  CBC - Abnormal; Notable for the following components:   WBC 11.1 (*)    All other components within normal limits  TROPONIN I (HIGH SENSITIVITY)  TROPONIN I (HIGH SENSITIVITY)    EKG None  Radiology CT Angio Chest PE W and/or Wo Contrast  Result Date: 04/19/2022 CLINICAL DATA:  Pulmonary embolism (PE) suspected, high prob Chest pain and shortness of breath. EXAM: CT ANGIOGRAPHY CHEST WITH CONTRAST TECHNIQUE: Multidetector CT imaging of the chest was performed using the standard protocol during bolus administration of intravenous contrast. Multiplanar CT image reconstructions and MIPs were obtained to evaluate the vascular anatomy. RADIATION DOSE REDUCTION: This exam was performed according to the departmental dose-optimization program which includes automated exposure control, adjustment of the mA and/or kV according to patient size and/or use of iterative reconstruction technique. CONTRAST:  75mL OMNIPAQUE IOHEXOL 350 MG/ML SOLN COMPARISON:  Radiograph earlier today. Report from chest CTA 05/03/2021, images unavailable. FINDINGS: Cardiovascular: There are no filling defects within the pulmonary arteries to suggest pulmonary embolus. Borderline cardiomegaly. Thoracic aorta is normal in caliber without acute findings. No pericardial effusion. Mediastinum/Nodes: No enlarged mediastinal or hilar lymph nodes. No esophageal wall thickening. No visible thyroid nodule. Lungs/Pleura: Bronchial thickening without focal airspace disease. No pleural effusion. No pulmonary nodule. No features of pulmonary edema. Upper Abdomen: No acute or unexpected findings. Musculoskeletal: There are no acute or suspicious osseous abnormalities. No chest wall soft tissue abnormalities. Review of the MIP images confirms the above findings. IMPRESSION: 1. No  pulmonary embolus. 2. Bronchial thickening, can be seen with bronchitis or asthma. Electronically Signed   By: Narda Rutherford M.D.   On: 04/19/2022 21:04   DG Chest 2 View  Result Date: 04/19/2022 CLINICAL DATA:  Chest pain EXAM: CHEST - 2 VIEW COMPARISON:  Chest x-ray August 30, 2021 FINDINGS: The cardiomediastinal silhouette is unchanged in contour. No focal pulmonary opacity. No pleural effusion or pneumothorax. The visualized upper abdomen is unremarkable. No acute osseous abnormality. IMPRESSION: No active cardiopulmonary disease. Electronically Signed   By: Jacob Moores M.D.   On: 04/19/2022 17:07    Procedures Procedures    Medications Ordered in ED Medications  iohexol (OMNIPAQUE) 350 MG/ML injection 75 mL (75 mLs Intravenous Contrast Given 04/19/22 2053)   ED Course/ Medical Decision Making/ A&P Clinical Course as of 04/19/22  2220  Sat Apr 19, 2022  2127 Stable 41 YOM with chest pain, history of myocarditis Sharp pain. Negative tr [CC]    Clinical Course User Index [CC] Glyn Ade, MD    42 year old history of myocarditis here for evaluation of chest pain.  Recently diagnosed with COVID 2 weeks ago.  Pain is sharp in nature however not necessarily pleuritic or exertional.  Some associated shortness of breath and fatigue.  He denies any prior history of PE or DVT.  No lower extremity swelling.  He is mildly tachycardic.  He is no longer on colchicine or metoprolol which he was on previously for his myocarditis.  Pain not positional in nature.  Will plan on labs, imaging and reassess  Labs and imaging personally viewed and interpreted:  CBC leukocytosis 11.1 BMP glucose 135 delta troponin 13--13 Chest x-ray without significant abnormality EKG without ischemic changes CTA with bronchitic changes, no PE, PNA  Patient reassessed.  I discussed his labs and imaging.  Troponin reassuring.  Low suspicion for acute ACS, PE, dissection, bacterial infectious process,  myocarditis/carditis, tamponade, pericardial effusion.  Do not feel he needs emergent echo at this time.  Will start on NSAIDs, possible musculoskeletal component.  Will have him follow-up with cardiology given his history of myocarditis however reassuring troponins at this time without positional nature of his chest pain I have low suspicion for this.  We discussed return precautions which she is agreeable for  The patient has been appropriately medically screened and/or stabilized in the ED. I have low suspicion for any other emergent medical condition which would require further screening, evaluation or treatment in the ED or require inpatient management.  Patient is hemodynamically stable and in no acute distress.  Patient able to ambulate in department prior to ED.  Evaluation does not show acute pathology that would require ongoing or additional emergent interventions while in the emergency department or further inpatient treatment.  I have discussed the diagnosis with the patient and answered all questions.  Pain is been managed while in the emergency department and patient has no further complaints prior to discharge.  Patient is comfortable with plan discussed in room and is stable for discharge at this time.  I have discussed strict return precautions for returning to the emergency department.  Patient was encouraged to follow-up with PCP/specialist refer to at discharge.     Click here for ABCD2, HEART and other calculatorsREFRESH Note before signing :1}          HEART Score: 0                Medical Decision Making Amount and/or Complexity of Data Reviewed External Data Reviewed: labs, radiology, ECG and notes. Labs: ordered. Decision-making details documented in ED Course. Radiology: ordered and independent interpretation performed. Decision-making details documented in ED Course. ECG/medicine tests: ordered and independent interpretation performed. Decision-making details documented  in ED Course.  Risk OTC drugs. Prescription drug management. Parenteral controlled substances. Decision regarding hospitalization. Diagnosis or treatment significantly limited by social determinants of health.          Final Clinical Impression(s) / ED Diagnoses Final diagnoses:  Chest pain, unspecified type  COVID    Rx / DC Orders ED Discharge Orders          Ordered    famotidine (PEPCID) 20 MG tablet  2 times daily        04/19/22 2158    celecoxib (CELEBREX) 200 MG capsule  2 times daily  04/19/22 2158              Lucresia Simic A, PA-C 04/19/22 2220    Glyn Ade, MD 04/20/22 1451

## 2022-04-19 NOTE — ED Triage Notes (Signed)
Pt with hx of myocarditis began having CP yesterday. Some radiation to left shoulder blade. No n/v.  Pt reports also being positive for covid about 2 weeks ago.  No SHOB or diaphoresis.

## 2022-04-19 NOTE — ED Notes (Signed)
Patient to CT.

## 2022-04-19 NOTE — ED Notes (Signed)
Patient transported to X-ray 

## 2022-04-19 NOTE — Discharge Instructions (Signed)
Workup today was reassuring in the emergency department.  Make sure to follow-up with cardiology to look know you are seen if her symptoms do not improve.  Return for new or worsening symptoms

## 2022-04-28 ENCOUNTER — Ambulatory Visit (HOSPITAL_BASED_OUTPATIENT_CLINIC_OR_DEPARTMENT_OTHER): Payer: 59 | Admitting: Family

## 2022-04-28 ENCOUNTER — Encounter (HOSPITAL_BASED_OUTPATIENT_CLINIC_OR_DEPARTMENT_OTHER): Payer: Self-pay | Admitting: Family

## 2022-04-28 VITALS — BP 123/75 | HR 77 | Ht 68.0 in | Wt 226.6 lb

## 2022-04-28 DIAGNOSIS — I319 Disease of pericardium, unspecified: Secondary | ICD-10-CM | POA: Diagnosis not present

## 2022-04-28 DIAGNOSIS — Z87898 Personal history of other specified conditions: Secondary | ICD-10-CM | POA: Diagnosis not present

## 2022-04-28 MED ORDER — CELECOXIB 200 MG PO CAPS
ORAL_CAPSULE | ORAL | 0 refills | Status: DC
Start: 2022-04-28 — End: 2023-03-27

## 2022-04-28 NOTE — Progress Notes (Signed)
Office Visit    Patient Name: Joshua [name] Date of Encounter: 04/28/2022  PCP:  Arlan Organ, MD   [facility]  Cardiologist:  Chilton Si, MD  Advanced Practice Provider:  No care team member to display Electrophysiologist:  None      Chief Complaint    Joshua [name] is a 42 y.o. male presents today for f/u of myopericarditis   Past Medical History    Past Medical History:  Diagnosis Date   Anxiety and depression    Central retinal vein occlusion of right eye    GERD (gastroesophageal reflux disease)    Migraines    Myopericarditis 05/20/2021   Schizophrenia    Syncopal episodes 05/03/2021   Past Surgical History:  Procedure Laterality Date   HERNIA REPAIR  2000    Allergies  No Known Allergies  History of Present Illness    Joshua [name] is a 42 y.o. male with a hx of schizophrenia, central retinal vein occlusion, myopericarditis, syncope last seen 03/21/22  Admitted 05/06/2021 with syncope.  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  Second while driving his car.  Cardiac enzymes elevated.  Cardiac catheterization outside hospital reportedly showed normal coronary arteries.  EKG inferolateral and anterior ST elevations.  Echo at outside hospital unremarkable.  Suspicious for myocarditis.  Cardiac MRI LVEF 52% with mild mid to basal anterolateral and inferolateral hypokinesis.  There was late gadolinium millimeter enhancement consistent with myopericarditis.  He was started on colchicine and metoprolol.  Monitor revealed predominantly normal sinus rhythm, rare PAC/PVC, 6 beat NSVT.  Seen in follow-up 05/20/2021.  Chest pain improved.  He was recommended to continue colchicine for a total of 3 months.  However after discontinuation he re-presented to the ED 08/30/2021 with chest discomfort.  CRP 1.8, sed rate 3, BNP 3.8. HS troponin 30 ? 30 which was improved compared to previous (05/07/21 HS  troponin 2,444). Colchicine and Metoprolol were resumed.  At follow-up 09/2021 colchicine taper planned. Seen 12/19/21 by Dr. Duke Salvia taking colchicine every other day with no chest pain. Saw pharmacy team 01/17/22 to discuss Arcolyst. Talked with pharmacy team 02/19/22 and opted not to pursue Arcolyst.   Seen 03/20/22 doing well.  Weight down nearly 20 pounds over the last 3 months.  Had been able to discontinue colchicine and metoprolol for greater than 1 month with no recurrent chest pain.  He preferred to avoid additional medications and wished to defer Arcolyst for now.  ED visit 04/19/2022 for chest pain.  EKG revealed sinus tachycardia.  CTA and troponin next 2 unremarkable.  Pain described as intermittent sharp chest pain to the left chest.  Of note diagnosed with COVID 2 weeks prior. He was discharged on Celebrex 200 mg twice a day for 10 days and Pepcid 20 mg twice a day.   Presents today for follow-up independently. Describes chest pain as feeling like a "knife" in his chest. Notes it progressed rather than improved which is when he went into the emergency room. Tells me this felt different than his previous myocarditis. He was reassured by troponin levels. He is taking Celebrex and Famotidine.  Taking Celebrex once per day with rare twice per day dosing if he has chest pain.  Cannot recall when he last took the second dose over the last week.   Has new insurance starting the first of the month through [employer] and is concerned that Dr. Duke Salvia may not be covered.  Encouraged  him to send [place] his new insurance information and we can further investigate once we have details about his plan, member ID.  EKGs/Labs/Other Studies Reviewed:   The following studies were reviewed today:   Cardiac MRI 05/07/21: FINDINGS: 1. Normal left ventricular size, with LVEDD 58 mm, and LVEDVi 79 mL/m2.   Normal left ventricular thickness, with intraventricular septal thickness of 7 mm, posterior  wall thickness of 9 mm, and myocardial mass index of 56 g/m2.   Low normal left ventricular systolic function (LVEF =52%). There is mid and basal lateral hypokinesis (anterolateral and inferolateral).   Left ventricular parametric mapping notable for increase T2 signal in the mid anterior, lateral, inferior, and in the base lateral and inferior distributions (highest 79 ms).   Elevated in the ECV signal in the lateral base and mid (highest signal, 43% mid anterolateral).   There is late gadolinium enhancement in the left ventricular myocardium- there is patchy LGE in the basal inferior, inferolateral, anterolateral, into the mid, lateral distribution. This is not an ischemic pattern of LGE.   2. Normal right ventricular size with RVEDVI 81 mL/m2.   Normal right ventricular thickness.   Low normal right ventricular systolic function (RVEF =44%). There are no regional wall motion abnormalities or aneurysms.   3.  Normal left and right atrial size.   4. Normal size of the aortic root, ascending aorta and pulmonary artery.   5. Valve assessment:   Aortic Valve: Tri-leaflet aortic valve. Qualitatively, there is no significant regurgitation.   Pulmonic Valve: Qualitatively, there is no significant regurgitation.   Tricuspid Valve: Qualitatively, there is no significant regurgitation.   Mitral Valve: There is no significant regurgitation.   6. Normal pericardium. Trivial pericardial effusion anterior to the right ventricle and posterior to the left ventricle. No pericardial thickening. No pericardial enhancement.   7. Grossly, no extracardiac findings. Recommended dedicated study if concerned for non-cardiac pathology.   8. Wrap around and cardiac motion artifacts noted. This decreased the sensitivity of volumetric assessments (RV quantification).   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  EKG: No EKG today  Recent Labs: 06/05/2021: ALT  50 08/30/2021: B Natriuretic Peptide 3.8 04/19/2022: BUN 13; Creatinine, Ser 0.84; Hemoglobin 16.0; Platelets 346; Potassium 3.9; Sodium 140  Recent Lipid Panel No results found for: "CHOL", "TRIG", "HDL", "CHOLHDL", "VLDL", "LDLCALC", "LDLDIRECT"  Home Medications   Current Meds  Medication Sig   ARIPiprazole (ABILIFY) 20 MG tablet Take 20 mg by mouth at bedtime.   celecoxib (CELEBREX) 200 MG capsule Take 1 capsule (200 mg total) by mouth 2 (two) times daily for 10 days.   esomeprazole (NEXIUM) 20 MG capsule Take 20 mg by mouth every evening.   famotidine (PEPCID) 20 MG tablet Take 1 tablet (20 mg total) by mouth 2 (two) times daily.   hydrOXYzine (ATARAX) 25 MG tablet Take 50 mg by mouth at bedtime as needed.   nitroGLYCERIN (NITROSTAT) 0.4 MG SL tablet Place 1 tablet (0.4 mg total) under the tongue every 5 (five) minutes as needed for chest pain.   NURTEC 75 MG TBDP Take 1 tablet by mouth every other day.   rizatriptan (MAXALT-MLT) 10 MG disintegrating tablet Take 10 mg by mouth as needed for migraine.     Review of Systems      All other systems reviewed and are otherwise negative except as noted above.  Physical Exam    VS:  BP 123/75 (BP Location: Left Arm, Patient Position: Sitting, Cuff Size: Large)  Pulse 77   Ht 5\' 8"  (1.727 m)   Wt 226 lb 9.6 oz (102.8 kg)   SpO2 97%   BMI 34.45 kg/m  , BMI Body mass index is 34.45 kg/m.  Wt Readings from Last 3 Encounters:  04/28/22 226 lb 9.6 oz (102.8 kg)  04/19/22 215 lb (97.5 kg)  03/21/22 212 lb (96.2 kg)    GEN: Well nourished, well developed, in no acute distress. HEENT: normal. Neck: Supple, no JVD, carotid bruits, or masses. Cardiac: RRR, no murmurs, rubs, or gallops. No clubbing, cyanosis, edema.  Radials/PT 2+ and equal bilaterally.  Respiratory:  Respirations regular and unlabored, clear to auscultation bilaterally. GI: Soft, nontender, nondistended. MS: No deformity or atrophy. Skin: Warm and dry, no  rash. Neuro:  Strength and sensation are intact. Psych: Normal affect.  Assessment & Plan    Syncope -occurred in the setting of myopericarditis.  No recurrence.  Myopericarditis -05/06/2021 admission with myopericarditis with syncope.  Unclear trigger though did note having a cold at the time which is likely cause. Repeat ED visit 08/2021. Required 6 mos of colchicine. Has been off colchicine/metoprolol for >1 month with no recurrent chest pain. Previously approved for Arcolyst prefers to avoid additional agents which is reasonable.  Recent ED visit with sharp left-sided chest pain which was different than previous pericarditis.  Low suspicion for recurrent myopericarditis is different nature of chest pain however further evaluation with plan for CRP, sed rate.  Will plan for gradual Celebrex taper.  He prefers to avoid Arcalyst if possible so if he has recurrent chest discomfort consider resuming low-dose metoprolol.  Snores -evaluated by otolaryngology.  Declined sleep study.  Oral device was provided by otolaryngology  Disposition: Follow up in 3 month(s) with Chilton Si, MD or APP.  Signed, Alver Sorrow, NP 04/28/2022, 10:59 AM [facility]

## 2022-04-28 NOTE — Patient Instructions (Addendum)
Medication Instructions:  Your physician has recommended you make the following change in your medication:   Plan to gradually taper Celebrex: Take one tablet TWICE daily for 3 days Then take one tablet ONCE daily of 7 days Then take one tablet every other day for 10 days Then stop  *If you need a refill on your cardiac medications before your next appointment, please call your pharmacy*   Lab Work/Testing/Procedures: Your physician recommends that you return for lab work today: CRP, sed rate  Follow-Up: At [facility], you and your health needs are our priority.  As part of our continuing mission to provide you with exceptional heart care, we have created designated Provider Care Teams.  These Care Teams include your primary Cardiologist (physician) and Advanced Practice Providers (APPs -  Physician Assistants and Nurse Practitioners) who all work together to provide you with the care you need, when you need it.  We recommend signing up for the patient portal called "MyChart".  Sign up information is provided on this After Visit Summary.  MyChart is used to connect with patients for Virtual Visits (Telemedicine).  Patients are able to view lab/test results, encounter notes, upcoming appointments, etc.  Non-urgent messages can be sent to your provider as well.   To learn more about what you can do with MyChart, go to [email or web address].    Your next appointment:   2-3 month(s)  Provider:   Chilton Si, MD or Gillian Shields, NP    Other Instructions   When you get your insurance card please send [place] a picture of it.   If you have recurrent chest pain, call us and let us know as we may resume your Metoprolol.

## 2022-04-29 LAB — SEDIMENTATION RATE: Sed Rate: 2 mm/hr (ref 0–15)

## 2022-04-29 LAB — C-REACTIVE PROTEIN: CRP: 2 mg/L (ref 0–10)

## 2022-04-30 DIAGNOSIS — M5416 Radiculopathy, lumbar region: Secondary | ICD-10-CM | POA: Diagnosis not present

## 2022-05-06 DIAGNOSIS — F5101 Primary insomnia: Secondary | ICD-10-CM | POA: Diagnosis not present

## 2022-05-06 DIAGNOSIS — F200 Paranoid schizophrenia: Secondary | ICD-10-CM | POA: Diagnosis not present

## 2022-05-12 DIAGNOSIS — H34811 Central retinal vein occlusion, right eye, with macular edema: Secondary | ICD-10-CM | POA: Diagnosis not present

## 2022-05-12 DIAGNOSIS — H43822 Vitreomacular adhesion, left eye: Secondary | ICD-10-CM | POA: Diagnosis not present

## 2022-05-12 DIAGNOSIS — H35033 Hypertensive retinopathy, bilateral: Secondary | ICD-10-CM | POA: Diagnosis not present

## 2022-05-28 ENCOUNTER — Telehealth (HOSPITAL_BASED_OUTPATIENT_CLINIC_OR_DEPARTMENT_OTHER): Payer: Self-pay | Admitting: Family

## 2022-05-28 DIAGNOSIS — N401 Enlarged prostate with lower urinary tract symptoms: Secondary | ICD-10-CM | POA: Diagnosis not present

## 2022-05-28 DIAGNOSIS — N503 Cyst of epididymis: Secondary | ICD-10-CM | POA: Diagnosis not present

## 2022-05-28 DIAGNOSIS — R350 Frequency of micturition: Secondary | ICD-10-CM | POA: Diagnosis not present

## 2022-05-28 DIAGNOSIS — R3914 Feeling of incomplete bladder emptying: Secondary | ICD-10-CM | POA: Diagnosis not present

## 2022-05-28 DIAGNOSIS — N5201 Erectile dysfunction due to arterial insufficiency: Secondary | ICD-10-CM | POA: Diagnosis not present

## 2022-05-28 NOTE — Telephone Encounter (Signed)
Unfortunately we cannot adjust what his insurance covers. He can reach out to his insurance to confirm that [facility] is not covered. If not, we can place a referral and send his records to a covered cardiologist.   Alver Sorrow, NP

## 2022-05-28 NOTE — Telephone Encounter (Signed)
Called patient and discussed information.  He states that is just Dr Duke Salvia not covered.  I advise to call them and see if all others are covered or a reason that particular doctor is not.  He will reach out to his insurance.

## 2022-05-28 NOTE — Telephone Encounter (Signed)
Patient called stating Joshua [name] was suppose to get back to him about looking into insurance.  He states his insurance is out of network with [place], and she was going to look into getting him getting him covered.

## 2022-06-03 ENCOUNTER — Telehealth (HOSPITAL_BASED_OUTPATIENT_CLINIC_OR_DEPARTMENT_OTHER): Payer: Self-pay | Admitting: Cardiovascular Disease

## 2022-06-03 NOTE — Telephone Encounter (Signed)
Chilton Si, MD  Noel Christmas, MD Cc: Burnell Blanks, LPN Hi Dr. Arita Miss,  Yes, he is OK to have tadalafil.  He has no coronary disease.  Juliette Alcide, he has a prescription for nitroglycerin, which I don't think he should have.  Can you please call and let him know he does not need this medication and definitely should not take it with tadalafil.  Thanks, Chilton Si  Advised patient, verbalized understanding

## 2022-06-03 NOTE — Telephone Encounter (Signed)
Printed 5/28

## 2022-06-03 NOTE — Telephone Encounter (Signed)
New Message:      Patient said Dr Arita Miss said he sent Dr Duke Salvia a message. He said that Dr Duke Salvia had not responded to him. He said it is concerning some medicine he needs to take.Joshua [name]

## 2022-06-20 ENCOUNTER — Emergency Department (HOSPITAL_COMMUNITY): Payer: BC Managed Care – PPO

## 2022-06-20 ENCOUNTER — Other Ambulatory Visit: Payer: Self-pay

## 2022-06-20 ENCOUNTER — Emergency Department (HOSPITAL_COMMUNITY)
Admission: EM | Admit: 2022-06-20 | Discharge: 2022-06-20 | Disposition: A | Discharge status: Home / Self Care | Payer: BC Managed Care – PPO | Attending: Emergency Medicine | Admitting: Emergency Medicine

## 2022-06-20 ENCOUNTER — Telehealth (HOSPITAL_BASED_OUTPATIENT_CLINIC_OR_DEPARTMENT_OTHER): Payer: Self-pay | Admitting: Cardiovascular Disease

## 2022-06-20 ENCOUNTER — Encounter (HOSPITAL_COMMUNITY): Payer: Self-pay

## 2022-06-20 DIAGNOSIS — R0789 Other chest pain: Secondary | ICD-10-CM | POA: Insufficient documentation

## 2022-06-20 DIAGNOSIS — R079 Chest pain, unspecified: Secondary | ICD-10-CM

## 2022-06-20 DIAGNOSIS — Z8679 Personal history of other diseases of the circulatory system: Secondary | ICD-10-CM | POA: Diagnosis not present

## 2022-06-20 LAB — BASIC METABOLIC PANEL
Anion gap: 10 (ref 5–15)
BUN: 17 mg/dL (ref 6–20)
CO2: 22 mmol/L (ref 22–32)
Calcium: 9.7 mg/dL (ref 8.9–10.3)
Chloride: 104 mmol/L (ref 98–111)
Creatinine, Ser: 0.92 mg/dL (ref 0.61–1.24)
GFR, Estimated: >60 mL/min (ref >60)
Glucose, Bld: 91 mg/dL (ref 70–99)
Potassium: 3.9 mmol/L (ref 3.5–5.1)
Sodium: 136 mmol/L (ref 135–145)

## 2022-06-20 LAB — SEDIMENTATION RATE: Sed Rate: 1 mm/hr (ref 0–16)

## 2022-06-20 LAB — C-REACTIVE PROTEIN: CRP: 1.1 mg/dL — ABNORMAL HIGH (ref <1.0)

## 2022-06-20 LAB — CBC
HCT: 44.4 % (ref 39.0–52.0)
Hemoglobin: 15.7 g/dL (ref 13.0–17.0)
MCH: 29.7 pg (ref 26.0–34.0)
MCHC: 35.4 g/dL (ref 30.0–36.0)
MCV: 83.9 fL (ref 80.0–100.0)
Platelets: 335 10*3/uL (ref 150–400)
RBC: 5.29 MIL/uL (ref 4.22–5.81)
RDW: 14.3 % (ref 11.5–15.5)
WBC: 8.9 10*3/uL (ref 4.0–10.5)
nRBC: 0 % (ref 0.0–0.2)

## 2022-06-20 LAB — TROPONIN I (HIGH SENSITIVITY)
Troponin I (High Sensitivity): 19 ng/L — ABNORMAL HIGH (ref <18)
Troponin I (High Sensitivity): 17 ng/L (ref <18)

## 2022-06-20 MED ORDER — IOHEXOL 350 MG/ML SOLN
75.0000 mL | Freq: Once | INTRAVENOUS | Status: AC | PRN
  Administered 2022-06-20: 75 mL via INTRAVENOUS

## 2022-06-20 MED ORDER — LIDOCAINE 5 % EX PTCH: 1.0000 | MEDICATED_PATCH | CUTANEOUS | 0 refills | Status: DC

## 2022-06-20 NOTE — Discharge Instructions (Signed)
Your history, exam, and workup today were overall reassuring.  We suspect this is more musculoskeletal and chest wall discomfort and cardiology agreed.  Your CT scan was reassuring and we feel you are safe for discharge home.  Please rest and stay hydrated and follow-up with your primary doctor.  You may use the Lidoderm patches along with your home Flexeril to help with your symptoms.  If any symptoms change or worsen acutely, please return to the nearest emergency department.

## 2022-06-20 NOTE — Telephone Encounter (Signed)
Call transferred as stat from call center,   Patient states that he is having a lot of chest pain, more so than he has ever had before. When asked if he has taken any nitroglycerin, he states he was told by Dr. Duke Salvia not to take it due to the medication he is on for his prostate. Patient notes a headache, sweating like crazy and inability to move due to level of pain.   Advised patient that he needs to be seen in the ED as soon as possible for prompt evaluation. He states is wife is there with him and will drive him in right now.

## 2022-06-20 NOTE — ED Notes (Signed)
Pt provided with AVS.  Education complete; all questions answered.  Pt leaving ED in stable condition at this time, ambulatory with all belongings. 

## 2022-06-20 NOTE — ED Triage Notes (Signed)
Pt c/o left sided chest pain that radiates to his left shoulder blade. Chest pain started about an hour and a half ago while he was at work. Broke out in a sweat when pain first started. Pt called cardiologist, and was told to come to ED. Was told not take nitroglycerin due to medication he takes for his prostrate.

## 2022-06-20 NOTE — ED Provider Notes (Signed)
[place] EMERGENCY DEPARTMENT AT [facility] Provider Note   CSN: [id number] Arrival date & time: 06/20/22  1236     History  Chief Complaint  Patient presents with   Chest Pain    Joshua [name] is a 42 y.o. male.  The history is provided by the patient, medical records and the spouse. No language interpreter was used.  Chest Pain Pain location:  L chest Pain quality: radiating and sharp   Pain radiates to:  L shoulder Pain severity:  Severe Onset quality:  Sudden Duration:  5 hours Timing:  Constant Progression:  Improving Chronicity:  New Context: not trauma   Relieved by:  Nothing Worsened by:  Deep breathing and certain positions Ineffective treatments:  None tried Associated symptoms: anxiety, diaphoresis and shortness of breath   Associated symptoms: no abdominal pain, no altered mental status, no back pain, no cough, no dizziness, no fatigue, no fever, no lower extremity edema, no nausea, no near-syncope, no numbness, no palpitations, no syncope, no vomiting and no weakness   Risk factors: no prior DVT/PE        Home Medications Prior to Admission medications   Medication Sig Start Date End Date Taking? Authorizing Provider  ARIPiprazole (ABILIFY) 20 MG tablet Take 20 mg by mouth at bedtime. 01/27/22   [provider]  celecoxib (CELEBREX) 200 MG capsule Take one tablet twice daily for 3 days. Then take one tablet daily for 7 days. Then take one tablet every other day for 10 days. Then discontinue. 04/28/22   Alver Sorrow, NP  esomeprazole (NEXIUM) 20 MG capsule Take 20 mg by mouth every evening. 02/11/21   [provider]  famotidine (PEPCID) 20 MG tablet Take 1 tablet (20 mg total) by mouth 2 (two) times daily. 04/19/22   Henderly, Britni A, PA-C  hydrOXYzine (ATARAX) 25 MG tablet Take 50 mg by mouth at bedtime as needed. 03/28/22   [provider]  nitroGLYCERIN (NITROSTAT) 0.4 MG SL tablet Place 1 tablet (0.4 mg  total) under the tongue every 5 (five) minutes as needed for chest pain. 03/21/22   Alver Sorrow, NP  NURTEC 75 MG TBDP Take 1 tablet by mouth every other day. 05/12/21   [provider]  rizatriptan (MAXALT-MLT) 10 MG disintegrating tablet Take 10 mg by mouth as needed for migraine. 12/12/21   [provider]      Allergies    Patient has no known allergies.    Review of Systems   Review of Systems  Constitutional:  Positive for diaphoresis. Negative for chills, fatigue and fever.  HENT:  Negative for congestion.   Eyes:  Negative for visual disturbance.  Respiratory:  Positive for shortness of breath. Negative for cough, chest tightness and wheezing.   Cardiovascular:  Positive for chest pain. Negative for palpitations, leg swelling, syncope and near-syncope.  Gastrointestinal:  Negative for abdominal pain, constipation, diarrhea, nausea and vomiting.  Genitourinary:  Negative for flank pain.  Musculoskeletal:  Negative for back pain, neck pain and neck stiffness.  Skin:  Negative for rash and wound.  Neurological:  Negative for dizziness, syncope, weakness, light-headedness and numbness.  Psychiatric/Behavioral:  Negative for agitation and confusion.   All other systems reviewed and are negative.   Physical Exam Updated Vital Signs BP 110/85 (BP Location: Right Arm)   Pulse 75   Temp 97.8 F (36.6 C) (Oral)   Resp 18   SpO2 99%  Physical Exam Vitals and nursing note reviewed.  Constitutional:  General: He is not in acute distress.    Appearance: He is well-developed. He is not ill-appearing, toxic-appearing or diaphoretic.  HENT:     Head: Normocephalic and atraumatic.  Eyes:     Conjunctiva/sclera: Conjunctivae normal.     Pupils: Pupils are equal, round, and reactive to light.  Cardiovascular:     Rate and Rhythm: Normal rate and regular rhythm.     Heart sounds: Normal heart sounds. No murmur heard. Pulmonary:     Effort: Pulmonary effort is  normal. No respiratory distress.     Breath sounds: Normal breath sounds. No decreased breath sounds, wheezing, rhonchi or rales.  Chest:     Chest wall: No tenderness.  Abdominal:     Palpations: Abdomen is soft.     Tenderness: There is no abdominal tenderness.  Musculoskeletal:        General: No swelling.     Cervical back: Neck supple.     Right lower leg: No tenderness. No edema.     Left lower leg: No tenderness. No edema.  Skin:    General: Skin is warm and dry.     Capillary Refill: Capillary refill takes less than 2 seconds.     Findings: No erythema.  Neurological:     General: No focal deficit present.     Mental Status: He is alert.  Psychiatric:        Mood and Affect: Mood normal.     ED Results / Procedures / Treatments   Labs (all labs ordered are listed, but only abnormal results are displayed) Labs Reviewed  C-REACTIVE PROTEIN - Abnormal; Notable for the following components:      Result Value   CRP 1.1 (*)    All other components within normal limits  TROPONIN I (HIGH SENSITIVITY) - Abnormal; Notable for the following components:   Troponin I (High Sensitivity) 19 (*)    All other components within normal limits  BASIC METABOLIC PANEL  CBC  SEDIMENTATION RATE  TROPONIN I (HIGH SENSITIVITY)    EKG EKG Interpretation  Date/Time:  Friday June 20 2022 12:20:06 EDT Ventricular Rate:  76 PR Interval:  160 QRS Duration: 84 QT Interval:  376 QTC Calculation: 423 R Axis:   0 Text Interpretation: Normal sinus rhythm Normal ECG When compared with ECG of 19-Apr-2022 16:44, PREVIOUS ECG IS PRESENT when compared top rior, slower rate. No STEMI Confirmed by Theda Belfast ([id number]) on 06/20/2022 4:09:16 PM  Radiology CT Angio Chest PE W and/or Wo Contrast  Result Date: 06/20/2022 CLINICAL DATA:  Left-sided chest pain and diaphoresis. High probability for pulmonary embolism. EXAM: CT ANGIOGRAPHY CHEST WITH CONTRAST TECHNIQUE: Multidetector CT imaging of the  chest was performed using the standard protocol during bolus administration of intravenous contrast. Multiplanar CT image reconstructions and MIPs were obtained to evaluate the vascular anatomy. RADIATION DOSE REDUCTION: This exam was performed according to the departmental dose-optimization program which includes automated exposure control, adjustment of the mA and/or kV according to patient size and/or use of iterative reconstruction technique. CONTRAST:  75mL OMNIPAQUE IOHEXOL 350 MG/ML SOLN COMPARISON:  04/19/2022 FINDINGS: Cardiovascular: Satisfactory opacification of pulmonary arteries noted, and no pulmonary emboli identified. No evidence of thoracic aortic dissection or aneurysm. Mediastinum/Nodes: No masses or pathologically enlarged lymph nodes identified. Lungs/Pleura: No pulmonary mass, infiltrate, or effusion. Upper abdomen: No acute findings. Musculoskeletal: No suspicious bone lesions identified. Review of the MIP images confirms the above findings. IMPRESSION: Negative. No evidence of pulmonary embolism or other active disease. Electronically  Signed   By: Danae Orleans M.D.   On: 06/20/2022 18:08   DG Chest 2 View  Result Date: 06/20/2022 CLINICAL DATA:  Chest pain EXAM: CHEST - 2 VIEW COMPARISON:  X-ray 04/19/2018 and CTA chest FINDINGS: No consolidation, pneumothorax or effusion. No edema. Normal cardiopericardial silhouette. Mild degenerative changes seen along the spine. IMPRESSION: No acute cardiopulmonary disease. Electronically Signed   By: Karen Kays M.D.   On: 06/20/2022 13:30    Procedures Procedures    Medications Ordered in ED Medications  iohexol (OMNIPAQUE) 350 MG/ML injection 75 mL (75 mLs Intravenous Contrast Given 06/20/22 1730)    ED Course/ Medical Decision Making/ A&P                             Medical Decision Making Amount and/or Complexity of Data Reviewed Labs: ordered. Radiology: ordered.  Risk Prescription drug management.    Joshua [name]  is a 42 y.o. male with a past medical history significant for schizophrenia, migraines, GERD, anxiety, depression, and previous myopericarditis who presents with chest discomfort.  According to patient, he called his cardiology team who told him to come to the emergency department for evaluation after he began having chest discomfort today around 10:30 AM.  He reports no provoking activities and had sudden onset of a sharp pain in his left chest going to his left shoulder.  It is pleuritic and he was very diaphoretic and short of breath with it.  He denied any vomiting but had some mild nausea initially.  He reports that has improved slightly.  He reports his pain is still present.  He called cardiology and they told him to come to the emergency department for evaluation given his history.  Patient denies recent fevers, chills, congestion, cough.  Denies any constipation, diarrhea, or urinary changes.  He does report starting a new medication about a week ago for his prostate.  He denies any leg pain or leg swelling and reports he did travel several months ago on an airplane but denies history of blood clots.  Denies any leg pain or leg swelling and otherwise felt like he was doing fairly well.  He reports he is have less energy today since this began.  He reports this feels different than his previous myopericarditis.  On exam, lungs were clear.  Chest was nontender and not reproduce his discomfort.  He did not have rash to suggest shingles.  He had intact pulses in upper and lower extremities.  Legs are nontender and nonedematous overall.  Back and flanks were nontender.  Patient otherwise well-appearing but appears anxious.  No carotid bruit on my exam.  No focal neurologic deficits.  Abdomen was nontender.  EKG did not show STEMI.  Patient seen initially in triage and had laboratory testing ordered.  His troponins returned at 19 and 17 respectively far lower than over 2000 when he had his myopericarditis  in the past.  Chest x-ray was also obtained and was reassuring.  Labs otherwise began to return reassuring.  Given the patient's discomfort and his history, and his recommendation of the emergency department by cardiology, will give cardiology call to come evaluate to determine if needs echo or determine outpatient follow-up plan for him.  With this pleuritic discomfort with diaphoresis shortness of breath and sudden onset sharp, and his recent travel, will consider getting workup to rule out pulm embolism as well.  Anticipate reassessment after workup to determine disposition.  5:03 PM Spoke to cardiology who will come see patient in the emergency department.  They suspect patient will likely be safe for discharge home and agreed with [place] getting a CT PE study to further evaluate.  Anticipate recommendations from cardiology and workup to be completed to determine disposition.  Cardiology felt this was likely noncardiac but did order ESR and CRP.  ESR returned normal and CRP slightly elevated at 1.1.  This does not appear to be critically elevated and I low suspicion for acute pericarditis at this time.  Suspect muscle skeletal pain and cardiology agreed.  CT PE study was reassuring.  We went over the findings and patient would like to go home and he can take his home Flexeril.  Will get prescription for Lidoderm patches and he will follow-up with his primary team.  He agreed with plan of care and was discharged in good condition.         Final Clinical Impression(s) / ED Diagnoses Final diagnoses:  Atypical chest pain    Rx / DC Orders ED Discharge Orders          Ordered    lidocaine (LIDODERM) 5 %  Every 24 hours        06/20/22 2108           Clinical Impression: 1. Atypical chest pain     Disposition: Discharge  Condition: Good  I have discussed the results, Dx and Tx plan with the pt(& family if present). He/she/they expressed understanding and agree(s) with the  plan. Discharge instructions discussed at great length. Strict return precautions discussed and pt &/or family have verbalized understanding of the instructions. No further questions at time of discharge.    New Prescriptions   LIDOCAINE (LIDODERM) 5 %    Place 1 patch onto the skin daily. Remove & Discard patch within 12 hours or as directed by MD    Follow Up: Arlan Organ, MD [address] [place] [address] [phone]     [facility] at [facility] [address] [id number] mc [place] [address] [phone]    your cardiologist        Kashae Carstens, Canary Brim, MD 06/20/22 2109

## 2022-06-20 NOTE — Telephone Encounter (Signed)
Pt c/o of Chest Pain: STAT if CP now or developed within 24 hours  1. Are you having CP right now? yes  2. Are you experiencing any other symptoms (ex. SOB, nausea, vomiting, sweating)? Patient states he had headache, states he was sweating but was outside.  3. How long have you been experiencing CP? Started about 30 mins ago  4. Is your CP continuous or coming and going? States it continuous  5. Have you taken Nitroglycerin? no ?

## 2022-06-20 NOTE — Consult Note (Addendum)
Cardiology Consultation   Patient ID: Joshua [name] MRN: [id number]; DOB: Jun 04, 1980  Admit date: 06/20/2022 Date of Consult: 06/20/2022  PCP:  Arlan Organ, MD   [facility] Providers Cardiologist:  Chilton Si, MD        Patient Profile:   Joshua [name] is a 42 y.o. male with a hx of anxiety/depression, GERD, central retinal vein occlusion of right eye, schizophrenia, syncope and history of myopericarditis who is being seen 06/20/2022 for the evaluation of chest pain at the request of Dr. Rush Landmark.  History of Present Illness:   Joshua [name] is a 42 year old male with past medical history of anxiety/depression, GERD, central retinal vein occlusion of right eye, schizophrenia, syncope and history of myopericarditis.  P patient to chest pain with elevated troponin on 05/03/2021 and went to [facility].  He had diffuse ST elevation on EKG and elevated troponin.  Subsequent cardiac catheterization showed normal coronary arteries.  Echocardiogram showed normal EF 55 to 60%.  He was readmitted to the hospital on 05/06/2021 with syncope after vomiting.  Cardiac MRI obtained on 05/07/2021 showed EF 52%, late gadolinium enhancement of LV myocardium with patchy LGE in the basal inferior, inferolateral, anterolateral distribution consistent with myocarditis.  Heart monitor obtained on 05/16/2021 showed sinus rhythm, rare PAC and PVCs, 6 beat run of nonsustained VT.  Last year, he finished a total of 47-month course of colchicine.  He was in his usual state of health until 10:30 AM this morning while at work.  He developed a sudden onset of diaphoresis and shortness of breath and left-sided sharp chest pain with left shoulder blade pain.  Prior to the event, he took a medication for migraine.  Diaphoresis and shortness of breath resolved however left-sided chest pain persisted.  He says the characteristic of the current symptom is different from the previous  myocarditis symptom from 2023.  The current symptom is more intense and involves the shoulder blade which was not present last year.  He has left-sided chest pain is worse with body rotation, deep inspiration and palpation.  He sought medical attention at [facility], ED.  Serial troponin 19--> 17.  Renal function and electrolyte were normal.  [name] cell count and white [name] cell count were normal.  EKG showed no acute changes.  CTA of the chest was obtained to rule out PE.  Result is currently pending.  Cardiology service consulted for chest pain.   Past Medical History:  Diagnosis Date   Anxiety and depression    Central retinal vein occlusion of right eye    GERD (gastroesophageal reflux disease)    Migraines    Myopericarditis 05/20/2021   Schizophrenia (HCC)    Syncopal episodes 05/03/2021    Past Surgical History:  Procedure Laterality Date   HERNIA REPAIR  2000     Home Medications:  Prior to Admission medications   Medication Sig Start Date End Date Taking? Authorizing Provider  ARIPiprazole (ABILIFY) 20 MG tablet Take 20 mg by mouth at bedtime. 01/27/22   [provider]  celecoxib (CELEBREX) 200 MG capsule Take one tablet twice daily for 3 days. Then take one tablet daily for 7 days. Then take one tablet every other day for 10 days. Then discontinue. 04/28/22   Alver Sorrow, NP  esomeprazole (NEXIUM) 20 MG capsule Take 20 mg by mouth every evening. 02/11/21   [provider]  famotidine (PEPCID) 20 MG tablet Take 1 tablet (20 mg total) by mouth 2 (  two) times daily. 04/19/22   Henderly, Britni A, PA-C  hydrOXYzine (ATARAX) 25 MG tablet Take 50 mg by mouth at bedtime as needed. 03/28/22   [provider]  nitroGLYCERIN (NITROSTAT) 0.4 MG SL tablet Place 1 tablet (0.4 mg total) under the tongue every 5 (five) minutes as needed for chest pain. 03/21/22   Alver Sorrow, NP  NURTEC 75 MG TBDP Take 1 tablet by mouth every other day. 05/12/21   [provider]  rizatriptan (MAXALT-MLT) 10 MG disintegrating tablet Take 10 mg by mouth as needed for migraine. 12/12/21   [provider]    Inpatient Medications: Scheduled Meds:  Continuous Infusions:  PRN Meds:   Allergies:   No Known Allergies  Social History:   Social History   Socioeconomic History   Marital status: Married    Spouse name: Not on file   Number of children: Not on file   Years of education: Not on file   Highest education level: Not on file  Occupational History   Occupation: tile work  Tobacco Use   Smoking status: Never   Smokeless tobacco: Never  Substance and Sexual Activity   Alcohol use: Never   Drug use: Never   Sexual activity: Not on file  Other Topics Concern   Not on file  Social History Narrative   Not on file   Social Determinants of Health   Financial Resource Strain: Not on file  Food Insecurity: Not on file  Transportation Needs: Not on file  Physical Activity: Not on file  Stress: Not on file  Social Connections: Not on file  Intimate Partner Violence: Not on file    Family History:    Family History  Problem Relation Age of Onset   Seizures Other    Stroke Maternal Aunt      ROS:  Please see the history of present illness.   All other ROS reviewed and negative.     Physical Exam/Data:   Vitals:   06/20/22 1245 06/20/22 1615 06/20/22 1640  BP: 110/85 117/80   Pulse: 75 82   Resp: 18 17   Temp: 97.8 F (36.6 C)  97.9 F (36.6 C)  TempSrc: Oral  Oral  SpO2: 99% 97%    No intake or output data in the 24 hours ending 06/20/22 1801    04/28/2022   10:54 AM 04/19/2022    5:11 PM 03/21/2022    9:04 AM  Last 3 Weights  Weight (lbs) 226 lb 9.6 oz 215 lb 212 lb  Weight (kg) 102.785 kg 97.523 kg 96.163 kg     There is no height or weight on file to calculate BMI.  General:  Well nourished, well developed, in no acute distress HEENT: normal Neck: no JVD Vascular: No carotid bruits; Distal pulses  2+ bilaterally Cardiac:  normal S1, S2; RRR; no murmur  Lungs:  clear to auscultation bilaterally, no wheezing, rhonchi or rales  Abd: soft, nontender, no hepatomegaly  Ext: no edema Musculoskeletal:  No deformities, BUE and BLE strength normal and equal Skin: warm and dry  Neuro:  CNs 2-12 intact, no focal abnormalities noted Psych:  Normal affect   EKG:  The EKG was personally reviewed and demonstrates: Normal sinus rhythm, nonspecific T wave changes.  No significant ST elevation or depression. Telemetry:  Telemetry was personally reviewed and demonstrates: Normal sinus rhythm, no significant ventricular ectopy.  Relevant CV Studies:  Cardiac MRI 05/07/2021 FINDINGS: 1. Normal left ventricular size, with LVEDD 58  mm, and LVEDVi 79 mL/m2.   Normal left ventricular thickness, with intraventricular septal thickness of 7 mm, posterior wall thickness of 9 mm, and myocardial mass index of 56 g/m2.   Low normal left ventricular systolic function (LVEF =52%). There is mid and basal lateral hypokinesis (anterolateral and inferolateral).   Left ventricular parametric mapping notable for increase T2 signal in the mid anterior, lateral, inferior, and in the base lateral and inferior distributions (highest 79 ms).   Elevated in the ECV signal in the lateral base and mid (highest signal, 43% mid anterolateral).   There is late gadolinium enhancement in the left ventricular myocardium- there is patchy LGE in the basal inferior, inferolateral, anterolateral, into the mid, lateral distribution. This is not an ischemic pattern of LGE.   2. Normal right ventricular size with RVEDVI 81 mL/m2.   Normal right ventricular thickness.   Low normal right ventricular systolic function (RVEF =44%). There are no regional wall motion abnormalities or aneurysms.   3.  Normal left and right atrial size.   4. Normal size of the aortic root, ascending aorta and pulmonary artery.   5. Valve  assessment:   Aortic Valve: Tri-leaflet aortic valve. Qualitatively, there is no significant regurgitation.   Pulmonic Valve: Qualitatively, there is no significant regurgitation.   Tricuspid Valve: Qualitatively, there is no significant regurgitation.   Mitral Valve: There is no significant regurgitation.   6. Normal pericardium. Trivial pericardial effusion anterior to the right ventricle and posterior to the left ventricle. No pericardial thickening. No pericardial enhancement.   7. Grossly, no extracardiac findings. Recommended dedicated study if concerned for non-cardiac pathology.   8. Wrap around and cardiac motion artifacts noted. This decreased the sensitivity of volumetric assessments (RV quantification).   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     Echo 05/04/2021 SUMMARY  Structurally normal heart.  Normal LV size, wall thickness, wall motion and systolic function with  ejection fraction 55-60%  There is no significant valvular stenosis or regurgitation.  There is no pericardial effusion.  There is no comparison study available.    Cath 05/03/2021 ANGIOGRAM/CORONARY ARTERIOGRAM:    The Coronary arteries were normal with no significant disease   LEFT VENTRICULOGRAM:  Left ventriculography was normal with an EF of  >70%.   Complications: None   Conclusions:   normal Coronary arteries as described.  normal Left ventricular systolic function as described   RECOMMENDATION:   The etiology of his troponin elevation is unclear.  We will observe him  overnight for any sign of rhythm problems.  Coronary Findings Diagnostic Dominance: Right  Left Main: The vessel was visualized by angiography, is moderate in size and is angiographically normal.  Left Anterior Descending: The vessel was visualized by angiography, is moderate in size and is angiographically normal.  Ramus Intermedius: The vessel was visualized by angiography, is  moderate in size and is angiographically normal.  Left Circumflex: The vessel was visualized by angiography, is moderate in size and is angiographically normal.  Right Coronary Artery: The vessel was visualized by angiography, is moderate in size and is angiographically normal.   Intervention  No interventions have been documented.   Left Ventricle  The left ventricular size is normal. The left ventricular systolic function is normal. LV end diastolic pressure is normal. There are no wall motion abnormalities in the left ventricle. The outflow tract is normal.   Laboratory Data:  High Sensitivity Troponin:   Recent Labs  Lab 06/20/22 1249 06/20/22  1521  TROPONINIHS 19* 17     Chemistry Recent Labs  Lab 06/20/22 1249  NA 136  K 3.9  CL 104  CO2 22  GLUCOSE 91  BUN 17  CREATININE 0.92  CALCIUM 9.7  GFRNONAA >60  ANIONGAP 10    No results for input(s): "PROT", "ALBUMIN", "AST", "ALT", "ALKPHOS", "BILITOT" in the last 168 hours. Lipids No results for input(s): "CHOL", "TRIG", "HDL", "LABVLDL", "LDLCALC", "CHOLHDL" in the last 168 hours.  Hematology Recent Labs  Lab 06/20/22 1249  WBC 8.9  RBC 5.29  HGB 15.7  HCT 44.4  MCV 83.9  MCH 29.7  MCHC 35.4  RDW 14.3  PLT 335   Thyroid No results for input(s): "TSH", "FREET4" in the last 168 hours.  BNPNo results for input(s): "BNP", "PROBNP" in the last 168 hours.  DDimer No results for input(s): "DDIMER" in the last 168 hours.   Radiology/Studies:  DG Chest 2 View  Result Date: 06/20/2022 CLINICAL DATA:  Chest pain EXAM: CHEST - 2 VIEW COMPARISON:  X-ray 04/19/2018 and CTA chest FINDINGS: No consolidation, pneumothorax or effusion. No edema. Normal cardiopericardial silhouette. Mild degenerative changes seen along the spine. IMPRESSION: No acute cardiopulmonary disease. Electronically Signed   By: Karen Kays M.D.   On: 06/20/2022 13:30     Assessment and Plan:   Atypical chest pain  -Symptom has been going on  since 10:30 AM this morning and has been constant for the past 7 hours.  He describes a left-sided chest pain with left shoulder blade pain.  The pain is worse with palpation, body rotation and deep inspiration.  Troponin was 19--17 which argues against ACS.  Patient had normal coronary arteries on previous cardiac catheterization in 2023. CTPA negative   -symptom is more consistent with musculoskeletal pain rather than cardiac pain.    History of myopericarditis: Occurred in April 2023.  Cardiac catheterization at the time showed normal coronary arteries.  Echocardiogram showed normal EF 55 to 60%.    Risk Assessment/Risk Scores:          For questions or updates, please contact [facility] Please consult [email or web address] for contact info under    Ramond Dial, [name]  06/20/2022 6:01 PM  Patient seen and examined.  Agree with above documentation.  Joshua [name] is a 42 year old male with a history of myopericarditis, schizophrenia who we are consulted by Dr. Rush Landmark for evaluation of chest pain.  On 05/03/2021 he was admitted in [facility] with chest pain.  EKG showed diffuse ST elevations and troponin was elevated.  He underwent cardiac catheterization that showed normal coronary arteries.  Echocardiogram showed EF 55 to 60%.  Was admitted to [facility] 05/06/2021 with syncope.  Cardiac MRI consistent with myocarditis.  He completed a 30-month course of colchicine.  He presents to the ED today with chest pain.  He reports pain on the left side of his chest that is tender to palpation, and worse with deep inspiration.  On presentation to ED, initial vital signs notable for BP 110/85, pulse 75, SpO2 99% on room air.  EKG shows normal sinus rhythm, rate 76, no ST abnormalities.  Labs notable for troponin 19 > 17, creatinine 0.9, hemoglobin 15.7, WBC 8.9, platelets 335.  CTPA unremarkable.  On exam, patient is alert and oriented, regular rate and rhythm, no murmurs, lungs CTAB, no LE edema.  For his  chest pain, given continuous pain for hours with unremarkable troponins, and considering tenderness to palpation on exam, suspect likely musculoskeletal  pain.  Does have history of pericarditis, but no EKG changes to suggest pericarditis, and while pain is worse with deep inspiration, tenderness to palpation argues for musculoskeletal cause.  CRP 1.1, ESR 1.  Suspect likely musculoskeletal pain and no further cardiac workup recommended at this time/  Little Ishikawa, MD

## 2022-07-04 ENCOUNTER — Ambulatory Visit (HOSPITAL_BASED_OUTPATIENT_CLINIC_OR_DEPARTMENT_OTHER): Payer: BC Managed Care – PPO | Admitting: Family

## 2022-07-09 DIAGNOSIS — G5603 Carpal tunnel syndrome, bilateral upper limbs: Secondary | ICD-10-CM | POA: Diagnosis not present

## 2022-07-09 DIAGNOSIS — M542 Cervicalgia: Secondary | ICD-10-CM | POA: Diagnosis not present

## 2022-07-31 DIAGNOSIS — M4802 Spinal stenosis, cervical region: Secondary | ICD-10-CM | POA: Diagnosis not present

## 2022-07-31 DIAGNOSIS — R3914 Feeling of incomplete bladder emptying: Secondary | ICD-10-CM | POA: Diagnosis not present

## 2022-07-31 DIAGNOSIS — F5101 Primary insomnia: Secondary | ICD-10-CM | POA: Diagnosis not present

## 2022-07-31 DIAGNOSIS — M7918 Myalgia, other site: Secondary | ICD-10-CM | POA: Diagnosis not present

## 2022-07-31 DIAGNOSIS — G8929 Other chronic pain: Secondary | ICD-10-CM | POA: Diagnosis not present

## 2022-07-31 DIAGNOSIS — N401 Enlarged prostate with lower urinary tract symptoms: Secondary | ICD-10-CM | POA: Diagnosis not present

## 2022-07-31 DIAGNOSIS — N5201 Erectile dysfunction due to arterial insufficiency: Secondary | ICD-10-CM | POA: Diagnosis not present

## 2022-07-31 DIAGNOSIS — M542 Cervicalgia: Secondary | ICD-10-CM | POA: Diagnosis not present

## 2022-07-31 DIAGNOSIS — M47812 Spondylosis without myelopathy or radiculopathy, cervical region: Secondary | ICD-10-CM | POA: Diagnosis not present

## 2022-07-31 DIAGNOSIS — F200 Paranoid schizophrenia: Secondary | ICD-10-CM | POA: Diagnosis not present

## 2022-07-31 DIAGNOSIS — N503 Cyst of epididymis: Secondary | ICD-10-CM | POA: Diagnosis not present

## 2022-08-04 DIAGNOSIS — H35033 Hypertensive retinopathy, bilateral: Secondary | ICD-10-CM | POA: Diagnosis not present

## 2022-08-04 DIAGNOSIS — H43822 Vitreomacular adhesion, left eye: Secondary | ICD-10-CM | POA: Diagnosis not present

## 2022-08-04 DIAGNOSIS — H34811 Central retinal vein occlusion, right eye, with macular edema: Secondary | ICD-10-CM | POA: Diagnosis not present

## 2022-08-09 DIAGNOSIS — R051 Acute cough: Secondary | ICD-10-CM | POA: Diagnosis not present

## 2022-08-09 DIAGNOSIS — R0981 Nasal congestion: Secondary | ICD-10-CM | POA: Diagnosis not present

## 2022-08-09 DIAGNOSIS — R070 Pain in throat: Secondary | ICD-10-CM | POA: Diagnosis not present

## 2022-08-09 DIAGNOSIS — R509 Fever, unspecified: Secondary | ICD-10-CM | POA: Diagnosis not present

## 2022-09-09 DIAGNOSIS — M79601 Pain in right arm: Secondary | ICD-10-CM | POA: Diagnosis not present

## 2022-09-09 DIAGNOSIS — M5001 Cervical disc disorder with myelopathy,  high cervical region: Secondary | ICD-10-CM | POA: Diagnosis not present

## 2022-09-09 DIAGNOSIS — M5124 Other intervertebral disc displacement, thoracic region: Secondary | ICD-10-CM | POA: Diagnosis not present

## 2022-09-09 DIAGNOSIS — M79602 Pain in left arm: Secondary | ICD-10-CM | POA: Diagnosis not present

## 2022-09-18 DIAGNOSIS — M719 Bursopathy, unspecified: Secondary | ICD-10-CM | POA: Diagnosis not present

## 2022-09-18 DIAGNOSIS — G8929 Other chronic pain: Secondary | ICD-10-CM | POA: Diagnosis not present

## 2022-09-18 DIAGNOSIS — M542 Cervicalgia: Secondary | ICD-10-CM | POA: Diagnosis not present

## 2022-09-18 DIAGNOSIS — M7918 Myalgia, other site: Secondary | ICD-10-CM | POA: Diagnosis not present

## 2022-09-18 DIAGNOSIS — H6123 Impacted cerumen, bilateral: Secondary | ICD-10-CM | POA: Diagnosis not present

## 2022-09-23 DIAGNOSIS — M5031 Other cervical disc degeneration,  high cervical region: Secondary | ICD-10-CM | POA: Diagnosis not present

## 2022-09-23 DIAGNOSIS — M9901 Segmental and somatic dysfunction of cervical region: Secondary | ICD-10-CM | POA: Diagnosis not present

## 2022-09-23 DIAGNOSIS — M50321 Other cervical disc degeneration at C4-C5 level: Secondary | ICD-10-CM | POA: Diagnosis not present

## 2022-09-23 DIAGNOSIS — M531 Cervicobrachial syndrome: Secondary | ICD-10-CM | POA: Diagnosis not present

## 2022-09-24 DIAGNOSIS — M50321 Other cervical disc degeneration at C4-C5 level: Secondary | ICD-10-CM | POA: Diagnosis not present

## 2022-09-24 DIAGNOSIS — M5031 Other cervical disc degeneration,  high cervical region: Secondary | ICD-10-CM | POA: Diagnosis not present

## 2022-09-24 DIAGNOSIS — M9901 Segmental and somatic dysfunction of cervical region: Secondary | ICD-10-CM | POA: Diagnosis not present

## 2022-09-24 DIAGNOSIS — M531 Cervicobrachial syndrome: Secondary | ICD-10-CM | POA: Diagnosis not present

## 2022-09-29 ENCOUNTER — Ambulatory Visit (HOSPITAL_BASED_OUTPATIENT_CLINIC_OR_DEPARTMENT_OTHER): Payer: Medicaid Other | Admitting: Cardiovascular Disease

## 2022-09-29 DIAGNOSIS — M531 Cervicobrachial syndrome: Secondary | ICD-10-CM | POA: Diagnosis not present

## 2022-09-29 DIAGNOSIS — M9901 Segmental and somatic dysfunction of cervical region: Secondary | ICD-10-CM | POA: Diagnosis not present

## 2022-09-29 DIAGNOSIS — M50321 Other cervical disc degeneration at C4-C5 level: Secondary | ICD-10-CM | POA: Diagnosis not present

## 2022-09-29 DIAGNOSIS — M5031 Other cervical disc degeneration,  high cervical region: Secondary | ICD-10-CM | POA: Diagnosis not present

## 2022-09-29 NOTE — Progress Notes (Deleted)
Cardiology Office Note:  .   Date:  09/29/2022  ID:  [name], DOB [birth year], MRN [id number] PCP: Arlan Organ, MD  [facility] Providers Cardiologist:  Chilton Si, MD { Click to update primary MD,subspecialty MD or APP then REFRESH:1}   History of Present Illness: .   Joshua [name] is a 42 y.o. male with schizophrenia and central retinal vein occlusion who presents for follow up on myopericarditis.  He was seen in the hospital 05/06/2021 with syncope.  He had 2 episodes of syncope.  The first occurred after getting hot and diaphoretic and while she was in a barbershop.  The second occurred on 05/06/21 while driving his car.  Cardiac enzymes were elevated.  He had a cardiac catheterization in outside hospital that reportedly showed normal coronary arteries.  EKG showed inferolateral and anterior ST elevations echo at the other hospital was unremarkable.  We were suspicious of myocarditis.  He denied any recent respiratory infections.  He also has never been vaccinated for COVID-19 and denied any COVID-19 infections.  Cardiac MRI revealed LVEF 52% with mild mid to basal anterolateral and inferolateral hypokinesis.  There was late gadolinium enhancement consistent with myopericarditis.  He was started on colchicine and metoprolol.  He has a history of central retinal vein occlusion.  The etiology is unclear.  He has a retina specialist, Dr. Reginal Lutes, who recommended that he be evaluated by hematology.  He has received injections with Eylea and there was concern about whether this may have contributed to his myopericarditis.   At follow-up 05/2021 he was feeling much better.  He saw Gillian Shields, NP 07/2021 and his symptoms had resolved.  He wore an ambulatory monitor and there were no significant arrhythmias.  Metoprolol was discontinued 08/2021.  He followed up with Luther Parody 09/2021 and noted that his chest pain recurred after stopping metoprolol.  He was seen in the ED  with minimally elevated CRP and troponin.  He was given an additional 3 months of colchicine with plans to gradually taper.  At his appointment 12/2021 he continued to have chest pain if he stopped colchicine or metoprolol.  We recommended starting Arcalyst.  He was referred to our pharmacist but never started taking.  He followed up with Gillian Shields, NP 03/2022 and was doing well.  He followed up/2020 for reported knifelike chest pain that felt different than his prior episodes of myopericarditis.  He remained on Celebrex and slowly tapered.  She recommended considering restarting metoprolol.  He was seen in the ED 06/2022 with chest pain.  High-sensitivity troponin was 19-->17.  He had chest wall tenderness on exam and it was thought to be musculoskeletal.  Objective findings were not consistent with pericarditis.     ROS: ***  Studies Reviewed: .        *** Risk Assessment/Calculations:   {Does this patient have ATRIAL FIBRILLATION?:[phone]} No BP recorded.  {Refresh Note OR Click here to enter BP  :1}***       Physical Exam:   VS:  There were no vitals taken for this visit.   Wt Readings from Last 3 Encounters:  04/28/22 226 lb 9.6 oz (102.8 kg)  04/19/22 215 lb (97.5 kg)  03/21/22 212 lb (96.2 kg)    GEN: Well nourished, well developed in no acute distress NECK: No JVD; No carotid bruits CARDIAC: ***RRR, no murmurs, rubs, gallops RESPIRATORY:  Clear to auscultation without rales, wheezing or rhonchi  ABDOMEN: Soft, non-tender, non-distended EXTREMITIES:  No edema; No  deformity   ASSESSMENT AND PLAN: .   ***    {Are you ordering a CV Procedure (e.g. stress test, cath, DCCV, TEE, etc)?   Press F2        :[id number]}  Dispo: ***  Signed, Chilton Si, MD

## 2022-10-27 DIAGNOSIS — L820 Inflamed seborrheic keratosis: Secondary | ICD-10-CM | POA: Diagnosis not present

## 2022-10-27 DIAGNOSIS — D229 Melanocytic nevi, unspecified: Secondary | ICD-10-CM | POA: Diagnosis not present

## 2022-10-27 DIAGNOSIS — L570 Actinic keratosis: Secondary | ICD-10-CM | POA: Diagnosis not present

## 2022-11-24 DIAGNOSIS — H35033 Hypertensive retinopathy, bilateral: Secondary | ICD-10-CM | POA: Diagnosis not present

## 2022-11-24 DIAGNOSIS — H43822 Vitreomacular adhesion, left eye: Secondary | ICD-10-CM | POA: Diagnosis not present

## 2022-11-24 DIAGNOSIS — H34811 Central retinal vein occlusion, right eye, with macular edema: Secondary | ICD-10-CM | POA: Diagnosis not present

## 2022-12-22 DIAGNOSIS — R635 Abnormal weight gain: Secondary | ICD-10-CM | POA: Diagnosis not present

## 2022-12-22 DIAGNOSIS — F5101 Primary insomnia: Secondary | ICD-10-CM | POA: Diagnosis not present

## 2022-12-22 DIAGNOSIS — F200 Paranoid schizophrenia: Secondary | ICD-10-CM | POA: Diagnosis not present

## 2023-01-14 ENCOUNTER — Telehealth: Payer: Self-pay | Admitting: Pharmacy Technician

## 2023-01-14 ENCOUNTER — Telehealth (HOSPITAL_BASED_OUTPATIENT_CLINIC_OR_DEPARTMENT_OTHER): Payer: Self-pay | Admitting: Cardiovascular Disease

## 2023-01-14 NOTE — Telephone Encounter (Signed)
 New Message:     Ref# [id number]     Joshua [name] is calling to check on the authorization for Arcalyst 220 mg

## 2023-01-14 NOTE — Telephone Encounter (Signed)
 Pharmacy Patient Advocate Encounter   Received notification from Pt Calls Messages that prior authorization for arcalyst  is required/requested.   Insurance verification completed.   The patient is insured through [facility] .   Per test claim: PA required; PA submitted to above mentioned insurance via CoverMyMeds Key/confirmation #/EOC [id number] Status is pending

## 2023-01-16 NOTE — Telephone Encounter (Signed)
 Pharmacy Patient Advocate Encounter  Received notification from [facility] that Prior Authorization for arcalyst has been APPROVED from 01/14/23 to 01/14/24   PA #/Case ID/Reference #: [id number]

## 2023-01-18 DIAGNOSIS — M25512 Pain in left shoulder: Secondary | ICD-10-CM | POA: Diagnosis not present

## 2023-01-18 DIAGNOSIS — G8929 Other chronic pain: Secondary | ICD-10-CM | POA: Diagnosis not present

## 2023-01-18 DIAGNOSIS — M542 Cervicalgia: Secondary | ICD-10-CM | POA: Diagnosis not present

## 2023-01-18 DIAGNOSIS — R200 Anesthesia of skin: Secondary | ICD-10-CM | POA: Diagnosis not present

## 2023-01-18 DIAGNOSIS — M549 Dorsalgia, unspecified: Secondary | ICD-10-CM | POA: Diagnosis not present

## 2023-01-18 DIAGNOSIS — R202 Paresthesia of skin: Secondary | ICD-10-CM | POA: Diagnosis not present

## 2023-01-22 ENCOUNTER — Telehealth (HOSPITAL_BASED_OUTPATIENT_CLINIC_OR_DEPARTMENT_OTHER): Payer: Self-pay | Admitting: Cardiovascular Disease

## 2023-01-22 NOTE — Telephone Encounter (Signed)
Spoke with [facility], they are asking for script for the arcalyst. Will forward to our pharmacist to help with script.

## 2023-01-22 NOTE — Telephone Encounter (Signed)
Pt c/o medication issue:  1. Name of Medication:   Arcalyst 220 mg   2. How are you currently taking this medication (dosage and times per day)?   3. Are you having a reaction (difficulty breathing--STAT)?   4. What is your medication issue?    Caller Kandis Mannan) called to follow-up on patient's prescription for Arcalyst 220 mg.

## 2023-01-23 DIAGNOSIS — M7918 Myalgia, other site: Secondary | ICD-10-CM | POA: Diagnosis not present

## 2023-01-23 DIAGNOSIS — M542 Cervicalgia: Secondary | ICD-10-CM | POA: Diagnosis not present

## 2023-01-23 DIAGNOSIS — M25512 Pain in left shoulder: Secondary | ICD-10-CM | POA: Diagnosis not present

## 2023-01-23 DIAGNOSIS — M25612 Stiffness of left shoulder, not elsewhere classified: Secondary | ICD-10-CM | POA: Diagnosis not present

## 2023-01-23 DIAGNOSIS — G8929 Other chronic pain: Secondary | ICD-10-CM | POA: Diagnosis not present

## 2023-01-23 DIAGNOSIS — M719 Bursopathy, unspecified: Secondary | ICD-10-CM | POA: Diagnosis not present

## 2023-01-23 DIAGNOSIS — Z0000 Encounter for general adult medical examination without abnormal findings: Secondary | ICD-10-CM | POA: Diagnosis not present

## 2023-01-23 DIAGNOSIS — R200 Anesthesia of skin: Secondary | ICD-10-CM | POA: Diagnosis not present

## 2023-01-24 DIAGNOSIS — M25512 Pain in left shoulder: Secondary | ICD-10-CM | POA: Diagnosis not present

## 2023-01-24 DIAGNOSIS — M19012 Primary osteoarthritis, left shoulder: Secondary | ICD-10-CM | POA: Diagnosis not present

## 2023-01-24 DIAGNOSIS — R200 Anesthesia of skin: Secondary | ICD-10-CM | POA: Diagnosis not present

## 2023-01-24 DIAGNOSIS — R202 Paresthesia of skin: Secondary | ICD-10-CM | POA: Diagnosis not present

## 2023-01-24 DIAGNOSIS — M25612 Stiffness of left shoulder, not elsewhere classified: Secondary | ICD-10-CM | POA: Diagnosis not present

## 2023-01-26 NOTE — Telephone Encounter (Signed)
Arcolyst was approved last year for patient, however he decided not to pursue use and has never picked up the medication.  This was generated from an automated request.

## 2023-01-26 NOTE — Telephone Encounter (Signed)
Patient declined medication last year after we got prior auth.  Looks like the PA renewal request came from [facility] and not the patient.  He is not using this.

## 2023-01-28 DIAGNOSIS — M542 Cervicalgia: Secondary | ICD-10-CM | POA: Diagnosis not present

## 2023-01-28 DIAGNOSIS — M25512 Pain in left shoulder: Secondary | ICD-10-CM | POA: Diagnosis not present

## 2023-02-04 DIAGNOSIS — N3943 Post-void dribbling: Secondary | ICD-10-CM | POA: Diagnosis not present

## 2023-02-04 DIAGNOSIS — N401 Enlarged prostate with lower urinary tract symptoms: Secondary | ICD-10-CM | POA: Diagnosis not present

## 2023-02-04 DIAGNOSIS — R3914 Feeling of incomplete bladder emptying: Secondary | ICD-10-CM | POA: Diagnosis not present

## 2023-02-09 DIAGNOSIS — M75112 Incomplete rotator cuff tear or rupture of left shoulder, not specified as traumatic: Secondary | ICD-10-CM | POA: Diagnosis not present

## 2023-03-16 DIAGNOSIS — H43822 Vitreomacular adhesion, left eye: Secondary | ICD-10-CM | POA: Diagnosis not present

## 2023-03-16 DIAGNOSIS — H34811 Central retinal vein occlusion, right eye, with macular edema: Secondary | ICD-10-CM | POA: Diagnosis not present

## 2023-03-16 DIAGNOSIS — H35033 Hypertensive retinopathy, bilateral: Secondary | ICD-10-CM | POA: Diagnosis not present

## 2023-03-18 ENCOUNTER — Other Ambulatory Visit (HOSPITAL_BASED_OUTPATIENT_CLINIC_OR_DEPARTMENT_OTHER): Payer: Self-pay | Admitting: Cardiovascular Disease

## 2023-03-21 ENCOUNTER — Emergency Department (HOSPITAL_COMMUNITY)

## 2023-03-21 ENCOUNTER — Other Ambulatory Visit: Payer: Self-pay

## 2023-03-21 ENCOUNTER — Emergency Department (HOSPITAL_COMMUNITY)
Admission: EM | Admit: 2023-03-21 | Discharge: 2023-03-21 | Disposition: A | Discharge status: Home / Self Care | Attending: Emergency Medicine | Admitting: Emergency Medicine

## 2023-03-21 ENCOUNTER — Encounter (HOSPITAL_COMMUNITY): Payer: Self-pay | Admitting: *Deleted

## 2023-03-21 DIAGNOSIS — R000 Tachycardia, unspecified: Secondary | ICD-10-CM | POA: Insufficient documentation

## 2023-03-21 DIAGNOSIS — J9811 Atelectasis: Secondary | ICD-10-CM | POA: Diagnosis not present

## 2023-03-21 DIAGNOSIS — R109 Unspecified abdominal pain: Secondary | ICD-10-CM | POA: Insufficient documentation

## 2023-03-21 DIAGNOSIS — R197 Diarrhea, unspecified: Secondary | ICD-10-CM | POA: Insufficient documentation

## 2023-03-21 DIAGNOSIS — R0789 Other chest pain: Secondary | ICD-10-CM | POA: Diagnosis not present

## 2023-03-21 DIAGNOSIS — R42 Dizziness and giddiness: Secondary | ICD-10-CM | POA: Diagnosis not present

## 2023-03-21 DIAGNOSIS — R0602 Shortness of breath: Secondary | ICD-10-CM | POA: Diagnosis not present

## 2023-03-21 DIAGNOSIS — R079 Chest pain, unspecified: Secondary | ICD-10-CM | POA: Diagnosis not present

## 2023-03-21 LAB — CBC
HCT: 45.1 % (ref 39.0–52.0)
Hemoglobin: 15.8 g/dL (ref 13.0–17.0)
MCH: 29.4 pg (ref 26.0–34.0)
MCHC: 35 g/dL (ref 30.0–36.0)
MCV: 84 fL (ref 80.0–100.0)
Platelets: 355 10*3/uL (ref 150–400)
RBC: 5.37 MIL/uL (ref 4.22–5.81)
RDW: 14.5 % (ref 11.5–15.5)
WBC: 10 10*3/uL (ref 4.0–10.5)
nRBC: 0 % (ref 0.0–0.2)

## 2023-03-21 LAB — COMPREHENSIVE METABOLIC PANEL
ALT: 33 U/L (ref 0–44)
AST: 24 U/L (ref 15–41)
Albumin: 4.4 g/dL (ref 3.5–5.0)
Alkaline Phosphatase: 59 U/L (ref 38–126)
Anion gap: 13 (ref 5–15)
BUN: 15 mg/dL (ref 6–20)
CO2: 22 mmol/L (ref 22–32)
Calcium: 9.8 mg/dL (ref 8.9–10.3)
Chloride: 103 mmol/L (ref 98–111)
Creatinine, Ser: 0.83 mg/dL (ref 0.61–1.24)
GFR, Estimated: >60 mL/min (ref >60)
Glucose, Bld: 107 mg/dL — ABNORMAL HIGH (ref 70–99)
Potassium: 3.5 mmol/L (ref 3.5–5.1)
Sodium: 138 mmol/L (ref 135–145)
Total Bilirubin: 0.7 mg/dL (ref 0.0–1.2)
Total Protein: 7.2 g/dL (ref 6.5–8.1)

## 2023-03-21 LAB — RESP PANEL BY RT-PCR (RSV, FLU A&B, COVID)  RVPGX2
Influenza A by PCR: NEGATIVE
Influenza B by PCR: NEGATIVE
Resp Syncytial Virus by PCR: NEGATIVE
SARS Coronavirus 2 by RT PCR: NEGATIVE

## 2023-03-21 LAB — TROPONIN I (HIGH SENSITIVITY)
Troponin I (High Sensitivity): 18 ng/L — ABNORMAL HIGH (ref <18)
Troponin I (High Sensitivity): 16 ng/L (ref <18)

## 2023-03-21 MED ORDER — COLCHICINE 0.6 MG PO TABS: 0.6000 mg | ORAL_TABLET | Freq: Every day | ORAL | 0 refills | Status: DC

## 2023-03-21 MED ORDER — IOHEXOL 350 MG/ML SOLN
75.0000 mL | Freq: Once | INTRAVENOUS | Status: AC | PRN
Start: 2023-03-21 — End: 2023-03-21
  Administered 2023-03-21: 75 mL via INTRAVENOUS

## 2023-03-21 MED ORDER — SODIUM CHLORIDE 0.9 % IV BOLUS
1000.0000 mL | Freq: Once | INTRAVENOUS | Status: AC
  Administered 2023-03-21: 1000 mL via INTRAVENOUS

## 2023-03-21 MED ORDER — MORPHINE SULFATE (PF) 4 MG/ML IV SOLN
4.0000 mg | Freq: Once | INTRAVENOUS | Status: AC
  Administered 2023-03-21: 4 mg via INTRAVENOUS
  Filled 2023-03-21: qty 1

## 2023-03-21 MED ORDER — METOPROLOL SUCCINATE ER 25 MG PO TB24: 25.0000 mg | ORAL_TABLET | Freq: Every day | ORAL | 0 refills | Status: DC

## 2023-03-21 NOTE — ED Triage Notes (Signed)
 Patient presents to ed c/o chest pain and left arm pain onset several days ago ,states he was at work c/o abd. cramping Pain went to the bathroom with diarrhea and c/o dizziness and nausead cold sweat went home rested x 2 hours and after talking with wife felt it was better to come and be checked out.

## 2023-03-21 NOTE — ED Provider Notes (Signed)
 [place] [facility] DEPARTMENT AT [facility] Provider Note   CSN: [id number] Arrival date & time: 03/21/23  1434     History  Chief Complaint  Patient presents with   Chest Pain    Joshua [name] is a 43 y.o. male with medical history of schizophrenia, GERD, myopericarditis, acute idiopathic myocarditis.  Patient presents to ED for evaluation of chest pain and shortness of breath.  The patient reports that for the last 2 days he has had left-sided chest pain associated with shortness of breath that is worse with exertion.  He reports that last night began having some abdominal pain and cramping, diarrhea.  Denies any nausea or vomiting.  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  He denies a history of blood clots or DVTs.  He denies leg swelling.  He is endorsing lightheadedness, dizziness worse with standing.  Reports history of myocarditis in 2023.  Has been seen since then for chest pain with unremarkable workups.   Chest Pain Associated symptoms: abdominal pain, dizziness and shortness of breath   Associated symptoms: no fever, no nausea and no vomiting        Home Medications Prior to Admission medications   Medication Sig Start Date End Date Taking? Authorizing Provider  colchicine 0.6 MG tablet Take 1 tablet (0.6 mg total) by mouth daily. 03/21/23  Yes Al Decant, PA-C  metoprolol succinate (TOPROL-XL) 25 MG 24 hr tablet Take 1 tablet (25 mg total) by mouth daily. 03/21/23  Yes Al Decant, PA-C  ARIPiprazole (ABILIFY) 20 MG tablet Take 20 mg by mouth at bedtime. 01/27/22   [provider]  celecoxib (CELEBREX) 200 MG capsule Take one tablet twice daily for 3 days. Then take one tablet daily for 7 days. Then take one tablet every other day for 10 days. Then discontinue. Patient not taking: Reported on 06/20/2022 04/28/22   Alver Sorrow, NP  escitalopram (LEXAPRO) 20 MG tablet Take 20 mg by mouth daily.     [provider]  esomeprazole (NEXIUM) 20 MG capsule Take 20 mg by mouth every evening. 02/11/21   [provider]  famotidine (PEPCID) 20 MG tablet Take 1 tablet (20 mg total) by mouth 2 (two) times daily. Patient not taking: Reported on 06/20/2022 04/19/22   Henderly, Britni A, PA-C  hydrOXYzine (ATARAX) 25 MG tablet Take 50 mg by mouth at bedtime. 03/28/22   [provider]  lidocaine (LIDODERM) 5 % Place 1 patch onto the skin daily. Remove & Discard patch within 12 hours or as directed by MD 06/20/22   Tegeler, Canary Brim, MD  nitroGLYCERIN (NITROSTAT) 0.4 MG SL tablet Place 1 tablet (0.4 mg total) under the tongue every 5 (five) minutes as needed for chest pain. Patient not taking: Reported on 06/20/2022 03/21/22   Alver Sorrow, NP  rizatriptan (MAXALT-MLT) 10 MG disintegrating tablet Take 10 mg by mouth as needed for migraine. 12/12/21   [provider]      Allergies    Patient has no known allergies.    Review of Systems   Review of Systems  Constitutional:  Negative for fever.  Respiratory:  Positive for shortness of breath.   Cardiovascular:  Positive for chest pain.  Gastrointestinal:  Positive for abdominal pain and diarrhea. Negative for nausea and vomiting.  Neurological:  Positive for dizziness and light-headedness.  All other systems reviewed and are negative.   Physical Exam Updated Vital Signs BP 113/71  Pulse 92   Temp 98 F (36.7 C) (Oral)   Resp 19   Ht 5\' 8"  (1.727 m)   Wt 99.8 kg   SpO2 98%   BMI 33.45 kg/m  Physical Exam Vitals and nursing note reviewed.  Constitutional:      General: He is not in acute distress.    Appearance: Normal appearance. He is not ill-appearing, toxic-appearing or diaphoretic.  HENT:     Head: Normocephalic and atraumatic.     Nose: Nose normal.     Mouth/Throat:     Mouth: Mucous membranes are moist.     Pharynx: Oropharynx is clear.  Eyes:     Extraocular Movements: Extraocular  movements intact.     Conjunctiva/sclera: Conjunctivae normal.     Pupils: Pupils are equal, round, and reactive to light.  Cardiovascular:     Rate and Rhythm: Regular rhythm. Tachycardia present.  Pulmonary:     Effort: Pulmonary effort is normal.     Breath sounds: Normal breath sounds. No wheezing.  Abdominal:     General: Abdomen is flat. Bowel sounds are normal.     Palpations: Abdomen is soft.     Tenderness: There is no abdominal tenderness.  Musculoskeletal:     Cervical back: Normal range of motion and neck supple. No tenderness.  Skin:    General: Skin is warm and dry.     Capillary Refill: Capillary refill takes less than 2 seconds.  Neurological:     Mental Status: He is alert and oriented to person, place, and time.     ED Results / Procedures / Treatments   Labs (all labs ordered are listed, but only abnormal results are displayed) Labs Reviewed  COMPREHENSIVE METABOLIC PANEL - Abnormal; Notable for the following components:      Result Value   Glucose, Bld 107 (*)    All other components within normal limits  TROPONIN I (HIGH SENSITIVITY) - Abnormal; Notable for the following components:   Troponin I (High Sensitivity) 18 (*)    All other components within normal limits  RESP PANEL BY RT-PCR (RSV, FLU A&B, COVID)  RVPGX2  CBC  TROPONIN I (HIGH SENSITIVITY)    EKG None  Radiology CT Angio Chest PE W and/or Wo Contrast Result Date: 03/21/2023 CLINICAL DATA:  Pulmonary embolism (PE) suspected, high prob EXAM: CT ANGIOGRAPHY CHEST WITH CONTRAST TECHNIQUE: Multidetector CT imaging of the chest was performed using the standard protocol during bolus administration of intravenous contrast. Multiplanar CT image reconstructions and MIPs were obtained to evaluate the vascular anatomy. RADIATION DOSE REDUCTION: This exam was performed according to the departmental dose-optimization program which includes automated exposure control, adjustment of the mA and/or kV  according to patient size and/or use of iterative reconstruction technique. CONTRAST:  75mL OMNIPAQUE IOHEXOL 350 MG/ML SOLN COMPARISON:  06/20/2022 FINDINGS: Cardiovascular: No filling defects in the pulmonary arteries to suggest pulmonary emboli. Heart is normal size. Aorta is normal caliber. Mediastinum/Nodes: No mediastinal, hilar, or axillary adenopathy. Trachea and esophagus are unremarkable. Thyroid unremarkable. Wispy soft tissue in the anterior mediastinum felt to reflect residual thymus. Lungs/Pleura: Minimal dependent atelectasis in the lower lobes. No confluent airspace opacities or effusions. Upper Abdomen: No acute findings Musculoskeletal: Chest wall soft tissues are unremarkable. No acute bony abnormality. Review of the MIP images confirms the above findings. IMPRESSION: No evidence of pulmonary embolus. No acute cardiopulmonary disease. Electronically Signed   By: Charlett Nose M.D.   On: 03/21/2023 18:03    Procedures Procedures  Medications Ordered in ED Medications  sodium chloride 0.9 % bolus 1,000 mL (0 mLs Intravenous Stopped 03/21/23 1723)  morphine (PF) 4 MG/ML injection 4 mg (4 mg Intravenous Given 03/21/23 1723)  iohexol (OMNIPAQUE) 350 MG/ML injection 75 mL (75 mLs Intravenous Contrast Given 03/21/23 1756)    ED Course/ Medical Decision Making/ A&P   Medical Decision Making Amount and/or Complexity of Data Reviewed Labs: ordered. Radiology: ordered.  Risk Prescription drug management.   44 year old male presents for evaluation.  Please see HPI for further details.  On examination patient is afebrile, tachycardic.  Lung sounds are clear bilaterally, he is not hypoxic.  Abdomen is soft and compressible.  Neurological examination is at baseline.  Will workup patient for ACS.  Will order CBC, BMP, troponins, CTA for PE, EKG, viral panel.  Will treat patient tachycardia with 1 L of fluid.  Patient troponin 18 initially, delta 16.  Patient CMP without electrolyte  derangement, no elevated LFTs, anion gap 13.  CBC without leukocytosis or anemia.  Viral panel negative for all.  CTA negative for PE.  EKG nonischemic, sinus tachycardia.  With 1 L of fluid, patient pulse rate normalized.  Discussed findings with the patient.  I advised the patient that I wish to consult with cardiology however he states that he will just wish to go back on colchicine and metoprolol.  Advised the patient that colchicine will worsen his diarrhea and he is understanding of this, he is still requesting colchicine.  He states he will follow-up with his cardiologist, Dr. Duke Salvia, this week.  Discussed with Dr. Rubin Payor and he is in agreement with the plan.  Will send patient home with colchicine and metoprolol.  Will have patient follow-up with cardiology.  Return precautions were given and he voiced understanding.  Stable to discharge.  Final Clinical Impression(s) / ED Diagnoses Final diagnoses:  Chest pain, unspecified type  Shortness of breath  Diarrhea, unspecified type    Rx / DC Orders ED Discharge Orders          Ordered    colchicine 0.6 MG tablet  Daily        03/21/23 1946    metoprolol succinate (TOPROL-XL) 25 MG 24 hr tablet  Daily        03/21/23 1946              Clent Ridges 03/21/23 1948    Benjiman Core, MD 03/21/23 2149

## 2023-03-21 NOTE — Discharge Instructions (Addendum)
 It was a pleasure taking part in your care.  As we discussed, your workup is reassuring here.  We are starting on colchicine at your request as well as metoprolol.  Please take these medications once a day.  You will take colchicine 0.6 mg once a day.  You will take 25 mg metoprolol today.  Please follow-up with Dr. Duke Salvia, your cardiologist, in the next 7 days.  Please return to the ED with any new or worsening signs or symptoms.

## 2023-03-21 NOTE — ED Provider Triage Note (Signed)
 Emergency Medicine Provider Triage Evaluation Note  Joshua [name] , a 43 y.o. male  was evaluated in triage.  Pt complains of chest pain, shortness of breath.  Patient reports that 2 days ago he developed left-sided chest pain associated with shortness of breath worse with exertion.  Reports that last night he began having some abdominal cramping, diarrhea.  Denies nausea or vomiting.  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  Denies a history of blood clots, DVTs.  Patient tachycardic on arrival.  Denies leg swelling.  Is endorsing lightheadedness and dizziness worse with standing.  Review of Systems  Positive:  Negative:   Physical Exam  BP (!) 146/91 (BP Location: Right Arm)   Pulse (!) 131   Temp 98.1 F (36.7 C)   Resp 18   Ht 5\' 8"  (1.727 m)   Wt 99.8 kg   SpO2 97%   BMI 33.45 kg/m  Gen:   Awake, no distress   Resp:  Normal effort  MSK:   Moves extremities without difficulty  Other:    Medical Decision Making  Medically screening exam initiated at 3:13 PM.  Appropriate orders placed.  Park Beck was informed that the remainder of the evaluation will be completed by another provider, this initial triage assessment does not replace that evaluation, and the importance of remaining in the ED until their evaluation is complete.     Al Decant, PA-C 03/21/23 1514

## 2023-03-23 ENCOUNTER — Telehealth (HOSPITAL_BASED_OUTPATIENT_CLINIC_OR_DEPARTMENT_OTHER): Payer: Self-pay | Admitting: Cardiovascular Disease

## 2023-03-23 ENCOUNTER — Encounter (HOSPITAL_BASED_OUTPATIENT_CLINIC_OR_DEPARTMENT_OTHER): Payer: Self-pay

## 2023-03-23 NOTE — Telephone Encounter (Signed)
 Called and spoke to patient. DOB verified.  Nurse's Recommendations:  - Discussed with Dr. Duke Salvia about previous stated concerns about driving; work note will be sent to pt through MyChart.  - When asked about pt trying out Arcalyst; pt is concerned about possible side effects but will discuss with wife about medication.  Patient verbalized understanding.

## 2023-03-23 NOTE — Telephone Encounter (Signed)
 Pt c/o Shortness Of Breath: STAT if SOB developed within the last 24 hours or pt is noticeably SOB on the phone  1. Are you currently SOB (can you hear that pt is SOB on the phone)?   Yes - comes and goes  2. How long have you been experiencing SOB?   Ongoing since Friday, 3/14  3. Are you SOB when sitting or when up moving around?   When up and moving around  4. Are you currently experiencing any other symptoms?   Fatigue and nausea a couple of times  Patient is concerned he has been having SOB and wants advice on next steps.

## 2023-03-23 NOTE — Telephone Encounter (Signed)
 Called and spoke to patient. DOB verified.  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  Nurse's Recommendations:  - r/s appt from 4/25 to 3/21 @ 8:25 am with Elliot Gurney, NP.  - will ask Dr. Duke Salvia if it is ok for pt to continue driving at this time.  - went over CP protocol with pt.  Patient verbalized understanding.

## 2023-03-26 NOTE — Progress Notes (Signed)
 " Cardiology Office Note:  .   Date:  03/27/2023  ID:  Joshua [name], DOB Nov 05, 1980, MRN [id number] PCP: Patrcia Lynwood RAMAN, MD  [facility] Providers Cardiologist:  Annabella Scarce, MD    History of Present Illness: .   Joshua [name] is a 43 y.o. male with a past medical history of myopericarditis, migraines, central retinal vein occlusion, GERD, schizophrenia, history of syncope and collapse.  06/06/2021 cMRI late gadolinium millimeter enhancement consistent with myopericarditis >> started on colchicine  and metoprolol  05/04/2021 echo EF 55 to 60%, no valvular abnormalities 05/03/2021 left heart cath normal coronary arteries  Evaluated in the hospital on 05/06/2021 with two thirds of syncope.  Cardiac enzymes have been negative, left heart cath revealed normal coronary arteries, EKG had revealed inferolateral and anterior ST elevation >> suspicion for myocarditis.  Cardiac MRI revealed EF 52% with LGE consistent with myopericarditis >> metoprolol  and colchicine .  There were some concerns that the injections he was receiving for his retinal vein occlusion could have contributed to his myopericarditis.  He had done poorly after stopping his colchicine , plans were to get him started on Arcalyst , and he was to meet with pharmacist, but ultimately did not want to start Arcalyst  secondary to adverse reactions he researched.   Most recently evaluated by Reche Finder, NP in April 2024, he had had a recent ED visit for left-sided chest pain but symptoms were different than prior episodes of pericarditis.  Evaluated in the emergency department on 03/21/2023 for chest pain, worsening shortness of breath. Troponin 18 > 16, lab work was normal. CTA negative for PE. He was given IV fluids and advised to follow up with cardiology.  He presents today for follow up after recent ED visit. His symptoms seem to be consistent with prior myopericarditis. He has chest pain, and shortness of breath. No  further episodes of syncope, but he is nervous that it will progress to this as it did in the past. He denies palpitations, dyspnea, pnd, orthopnea, n, v, dizziness, syncope, edema, weight gain, or early satiety.    ROS: Review of Systems  Respiratory:  Positive for shortness of breath.   Cardiovascular:  Positive for chest pain.  All other systems reviewed and are negative.    Studies Reviewed: .        Cardiac Studies & Procedures   ______________________________________________________________________________________________        SHERRILEE  LONG TERM MONITOR-LIVE TELEMETRY (3-14 DAYS) 05/16/2021  Narrative 5 day Zio Monitor  Quality: Fair.  Baseline artifact. Predominant rhythm: sinus rhtyhm Average heart rate: 79 bpm Max heart rate: 143 bpm Min heart rate: 49 bpm Pauses >2.5 seconds: None  Rare PACs and PVCs. 6 beats NSVT   Tiffany C. Scarce, MD, [facility] 06/06/2021 11:27 AM     CARDIAC MRI  MR CARDIAC MORPHOLOGY W WO CONTRAST 05/07/2021  Narrative CLINICAL DATA:  Clinical question of myocarditis Study assumes HCT of 38.6  And BSA of 2.15 m2.  EXAM: CARDIAC MRI  TECHNIQUE: The patient was scanned on a 1.5 Tesla GE magnet. A dedicated cardiac coil was used. Functional imaging was done using Fiesta sequences. 2,3, and 4 chamber views were done to assess for RWMA's. Modified Simpson's rule using a short axis stack was used to calculate an ejection fraction on a dedicated work [employer]. The patient received 12 cc of Gadavist . After 10 minutes inversion recovery sequences were used to assess for infiltration and scar tissue.  CONTRAST:  12 cc  of Gadavist   FINDINGS: 1. Normal left ventricular size, with LVEDD 58 mm, and LVEDVi 79 mL/m2.  Normal left ventricular thickness, with intraventricular septal thickness of 7 mm, posterior wall thickness of 9 mm, and myocardial mass index of 56 g/m2.  Low normal left ventricular systolic  function (LVEF =52%). There is mid and basal lateral hypokinesis (anterolateral and inferolateral).  Left ventricular parametric mapping notable for increase T2 signal in the mid anterior, lateral, inferior, and in the base lateral and inferior distributions (highest 79 ms).  Elevated in the ECV signal in the lateral base and mid (highest signal, 43% mid anterolateral).  There is late gadolinium enhancement in the left ventricular myocardium- there is patchy LGE in the basal inferior, inferolateral, anterolateral, into the mid, lateral distribution. This is not an ischemic pattern of LGE.  2. Normal right ventricular size with RVEDVI 81 mL/m2.  Normal right ventricular thickness.  Low normal right ventricular systolic function (RVEF =44%). There are no regional wall motion abnormalities or aneurysms.  3.  Normal left and right atrial size.  4. Normal size of the aortic root, ascending aorta and pulmonary artery.  5. Valve assessment:  Aortic Valve: Tri-leaflet aortic valve. Qualitatively, there is no significant regurgitation.  Pulmonic Valve: Qualitatively, there is no significant regurgitation.  Tricuspid Valve: Qualitatively, there is no significant regurgitation.  Mitral Valve: There is no significant regurgitation.  6. Normal pericardium. Trivial pericardial effusion anterior to the right ventricle and posterior to the left ventricle. No pericardial thickening. No pericardial enhancement.  7. Grossly, no extracardiac findings. Recommended dedicated study if concerned for non-cardiac pathology.  8. Wrap around and cardiac motion artifacts noted. This decreased the sensitivity of volumetric assessments (RV quantification).  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  Stanly Leavens MD   Electronically Signed By: Stanly Leavens M.D. On: 05/07/2021  08:33   ______________________________________________________________________________________________      Risk Assessment/Calculations:             Physical Exam:   VS:  BP 125/83   Pulse 76   Ht 5' 8 (1.727 m)   Wt 233 lb (105.7 kg)   SpO2 96%   BMI 35.43 kg/m    Wt Readings from Last 3 Encounters:  03/27/23 233 lb (105.7 kg)  03/21/23 220 lb (99.8 kg)  04/28/22 226 lb 9.6 oz (102.8 kg)    GEN: Well nourished, well developed in no acute distress NECK: No JVD; No carotid bruits CARDIAC: RRR, no murmurs, rubs, gallops RESPIRATORY:  Clear to auscultation without rales, wheezing or rhonchi  ABDOMEN: Soft, non-tender, non-distended EXTREMITIES:  No edema; No deformity   ASSESSMENT AND PLAN: .   Myopericarditis - he has been restarted on colchicine  and metoprolol  a few days ago, is not feeling much relief yet. Will add ibuprofen 400 mg every 6 hours for 7 days and Nexium for GI protection. CRP, CBC, and sed rate today. Repeat cMRI.   Syncope and collapse - no further episodes, occurred in the setting of myopericarditis. 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 Will fill our necessary paperwork that he presents today.   Palpitations - two week Zio monitor.        Dispo: Monitor, cMRI, labs per above, NSAIDs with PPI, Follow up in 4 weeks with Dr. Raford or Reche Finder.   Signed, Delon [name] Hoover, NP  "

## 2023-03-27 ENCOUNTER — Ambulatory Visit: Attending: Cardiology | Admitting: Cardiology

## 2023-03-27 ENCOUNTER — Encounter: Payer: Self-pay | Admitting: Cardiology

## 2023-03-27 ENCOUNTER — Ambulatory Visit

## 2023-03-27 VITALS — BP 125/83 | HR 76 | Ht 68.0 in | Wt 233.0 lb

## 2023-03-27 DIAGNOSIS — H348192 Central retinal vein occlusion, unspecified eye, stable: Secondary | ICD-10-CM

## 2023-03-27 DIAGNOSIS — R002 Palpitations: Secondary | ICD-10-CM

## 2023-03-27 DIAGNOSIS — Z87898 Personal history of other specified conditions: Secondary | ICD-10-CM

## 2023-03-27 DIAGNOSIS — I319 Disease of pericardium, unspecified: Secondary | ICD-10-CM

## 2023-03-27 NOTE — Patient Instructions (Signed)
 Medication Instructions:   Take ibuprofen  400 mg every 6 hours for 7 days.   Take Nexium over the counter as directed.   *If you need a refill on your cardiac medications before your next appointment, please call your pharmacy*   Lab Work: Your physician recommends that you have labs done in the office today. Your test included  CBC, CRP, Sed rate  If you have labs (blood work) drawn today and your tests are completely normal, you will receive your results only by: MyChart Message (if you have MyChart) OR A paper copy in the mail If you have any lab test that is abnormal or we need to change your treatment, we will call you to review the results.   Testing/Procedures:  A zio monitor was ordered today. It will remain on for 14 days. Remove 04/10/2023. You will then return monitor and event diary in provided box. It takes 1-2 weeks for report to be downloaded and returned to [place]. We will call you with the results. If monitor falls off or has orange flashing light, please call Zio for further instructions.    You are scheduled for Cardiac MRI at the location below.  Please arrive for your appointment [facility] [address] [place], [place] [address] [phone] Please take advantage of the free valet parking available at the [facility] and [employer] (Entrance C).  Proceed to the [facility] Radiology Department (First Floor) for check-in.    Magnetic resonance imaging (MRI) is a painless test that produces images of the inside of the body without using Xrays.  During an MRI, strong magnets and radio waves work together in a Data processing manager to form detailed images.   MRI images may provide more details about a medical condition than X-rays, CT scans, and ultrasounds can provide.  You may be given earphones to listen for instructions.  You may eat a light breakfast and take medications as ordered with the exception of furosemide, hydrochlorothiazide,  chlorthalidone or spironolactone (or any other fluid pill). If you are undergoing a stress MRI, please avoid stimulants for 12 hr prior to test. (I.e. Caffeine, nicotine, chocolate, or antihistamine medications)  An IV will be inserted into one of your veins. Contrast material will be injected into your IV. It will leave your body through your urine within a day. You may be told to drink plenty of fluids to help flush the contrast material out of your system.  You will be asked to remove all metal, including: Watch, jewelry, and other metal objects including hearing aids, hair pieces and dentures. Also wearable glucose monitoring systems (ie. Freestyle Libre and Omnipods) (Braces and fillings normally are not a problem.)   TEST WILL TAKE APPROXIMATELY 1 HOUR  PLEASE NOTIFY SCHEDULING AT LEAST 24 HOURS IN ADVANCE IF YOU ARE UNABLE TO KEEP YOUR APPOINTMENT. [phone]  For more information and frequently asked questions, please visit our website : [email or web address]  Please call the Cardiac Imaging Nurse Navigators with any questions/concerns. [phone] Office    Follow-Up: At [facility], you and your health needs are our priority.  As part of our continuing mission to provide you with exceptional heart care, we have created designated Provider Care Teams.  These Care Teams include your primary Cardiologist (physician) and Advanced Practice Providers (APPs -  Physician Assistants and Nurse Practitioners) who all work together to provide you with the care you need, when you need it.  We recommend signing up for the patient portal called "  MyChart".  Sign up information is provided on this After Visit Summary.  MyChart is used to connect with patients for Virtual Visits (Telemedicine).  Patients are able to view lab/test results, encounter notes, upcoming appointments, etc.  Non-urgent messages can be sent to your provider as well.   To learn more about what you can do with  MyChart, go to [email or web address].    Your next appointment:   4 week follow up with Andrey Cota

## 2023-03-28 LAB — C-REACTIVE PROTEIN: CRP: 2 mg/L (ref 0–10)

## 2023-03-28 LAB — CBC
Hematocrit: 46.3 % (ref 37.5–51.0)
Hemoglobin: 15.3 g/dL (ref 13.0–17.7)
MCH: 28.7 pg (ref 26.6–33.0)
MCHC: 33 g/dL (ref 31.5–35.7)
MCV: 87 fL (ref 79–97)
Platelets: 324 x10E3/uL (ref 150–450)
RBC: 5.33 x10E6/uL (ref 4.14–5.80)
RDW: 13.9 % (ref 11.6–15.4)
WBC: 6.6 x10E3/uL (ref 3.4–10.8)

## 2023-03-28 LAB — SEDIMENTATION RATE: Sed Rate: 2 mm/h (ref 0–15)

## 2023-03-30 ENCOUNTER — Encounter (HOSPITAL_COMMUNITY): Payer: Self-pay

## 2023-04-01 ENCOUNTER — Other Ambulatory Visit: Payer: Self-pay

## 2023-04-01 ENCOUNTER — Encounter (HOSPITAL_COMMUNITY): Payer: Self-pay

## 2023-04-01 ENCOUNTER — Telehealth: Payer: Self-pay | Admitting: Family

## 2023-04-01 ENCOUNTER — Emergency Department (HOSPITAL_COMMUNITY)
Admission: EM | Admit: 2023-04-01 | Discharge: 2023-04-01 | Disposition: A | Discharge status: Home / Self Care | Attending: Emergency Medicine | Admitting: Emergency Medicine

## 2023-04-01 ENCOUNTER — Emergency Department (HOSPITAL_COMMUNITY)

## 2023-04-01 DIAGNOSIS — R000 Tachycardia, unspecified: Secondary | ICD-10-CM | POA: Diagnosis not present

## 2023-04-01 DIAGNOSIS — R0602 Shortness of breath: Secondary | ICD-10-CM | POA: Diagnosis not present

## 2023-04-01 DIAGNOSIS — I10 Essential (primary) hypertension: Secondary | ICD-10-CM | POA: Diagnosis not present

## 2023-04-01 DIAGNOSIS — R079 Chest pain, unspecified: Secondary | ICD-10-CM | POA: Insufficient documentation

## 2023-04-01 DIAGNOSIS — Z79899 Other long term (current) drug therapy: Secondary | ICD-10-CM | POA: Diagnosis not present

## 2023-04-01 DIAGNOSIS — R0789 Other chest pain: Secondary | ICD-10-CM | POA: Diagnosis not present

## 2023-04-01 LAB — BASIC METABOLIC PANEL
Anion gap: 9 (ref 5–15)
BUN: 22 mg/dL — ABNORMAL HIGH (ref 6–20)
CO2: 22 mmol/L (ref 22–32)
Calcium: 8.7 mg/dL — ABNORMAL LOW (ref 8.9–10.3)
Chloride: 110 mmol/L (ref 98–111)
Creatinine, Ser: 0.91 mg/dL (ref 0.61–1.24)
GFR, Estimated: >60 mL/min (ref >60)
Glucose, Bld: 94 mg/dL (ref 70–99)
Potassium: 3.9 mmol/L (ref 3.5–5.1)
Sodium: 141 mmol/L (ref 135–145)

## 2023-04-01 LAB — I-STAT CG4 LACTIC ACID, ED
Lactic Acid, Venous: 1.7 mmol/L (ref 0.5–1.9)
Lactic Acid, Venous: 1.5 mmol/L (ref 0.5–1.9)

## 2023-04-01 LAB — CBC
HCT: 40 % (ref 39.0–52.0)
Hemoglobin: 13.9 g/dL (ref 13.0–17.0)
MCH: 29.4 pg (ref 26.0–34.0)
MCHC: 34.8 g/dL (ref 30.0–36.0)
MCV: 84.6 fL (ref 80.0–100.0)
Platelets: 261 10*3/uL (ref 150–400)
RBC: 4.73 MIL/uL (ref 4.22–5.81)
RDW: 14.6 % (ref 11.5–15.5)
WBC: 8.4 10*3/uL (ref 4.0–10.5)
nRBC: 0 % (ref 0.0–0.2)

## 2023-04-01 LAB — TROPONIN I (HIGH SENSITIVITY): Troponin I (High Sensitivity): 17 ng/L (ref <18)

## 2023-04-01 MED ORDER — SODIUM CHLORIDE 0.9 % IV BOLUS
1000.0000 mL | Freq: Once | INTRAVENOUS | Status: AC
  Administered 2023-04-01: 1000 mL via INTRAVENOUS

## 2023-04-01 MED ORDER — ONDANSETRON HCL 4 MG/2ML IJ SOLN
4.0000 mg | Freq: Once | INTRAMUSCULAR | Status: AC
  Administered 2023-04-01: 4 mg via INTRAVENOUS
  Filled 2023-04-01: qty 2

## 2023-04-01 MED ORDER — MORPHINE SULFATE (PF) 4 MG/ML IV SOLN
4.0000 mg | Freq: Once | INTRAVENOUS | Status: AC
  Administered 2023-04-01: 4 mg via INTRAVENOUS
  Filled 2023-04-01: qty 1

## 2023-04-01 MED ORDER — KETOROLAC TROMETHAMINE 15 MG/ML IJ SOLN
15.0000 mg | Freq: Once | INTRAMUSCULAR | Status: AC
  Administered 2023-04-01: 15 mg via INTRAVENOUS
  Filled 2023-04-01: qty 1

## 2023-04-01 MED ORDER — ONDANSETRON 4 MG PO TBDP: 4.0000 mg | ORAL_TABLET | Freq: Four times a day (QID) | ORAL | 0 refills | Status: AC | PRN

## 2023-04-01 NOTE — Discharge Instructions (Signed)
 Please use Tylenol or ibuprofen for pain.  You may use 600 mg ibuprofen every 6 hours or 1000 mg of Tylenol every 6 hours.  You may choose to alternate between the 2.  This would be most effective.  Not to exceed 4 g of Tylenol within 24 hours.  Not to exceed 3200 mg ibuprofen 24 hours.  Please follow-up closely with your cardiologist, your workup today in the emergency department was reassuring.

## 2023-04-01 NOTE — Telephone Encounter (Signed)
 Patient identification verified by 2 forms. Marilynn Rail, RN    Receive call from patient  Patient states:   -recently had lunch and has not felt well since   -he is having SOB, chest pain and nausea   -symptoms on going for 1.5 hours   -pain travel up into his chest, coming and going   -"it is pretty bad for me to be calling in"   -pain is 8/10   -takes Nexium daily, does not feel like acid reflux   -Took tadalafil last night  Advised patient to present to ED  Patient verbalized understanding, no further questions at this time

## 2023-04-01 NOTE — Telephone Encounter (Signed)
   Pt c/o of Chest Pain: STAT if active CP, including tightness, pressure, jaw pain, radiating pain to shoulder/upper arm/back, CP unrelieved by Nitro. Symptoms reported of SOB, nausea, vomiting, sweating.  1. Are you having CP right now? yes    2. Are you experiencing any other symptoms (ex. SOB, nausea, vomiting, sweating)? Nausea, sob   3. Is your CP continuous or coming and going? Coming and going   4. Have you taken Nitroglycerin? no   5. How long have you been experiencing CP? x1.5 hr     6. If NO CP at time of call then end call with telling Pt to call back or call 911 if Chest pain returns prior to return call from triage team.

## 2023-04-01 NOTE — ED Provider Notes (Signed)
 [place] EMERGENCY DEPARTMENT AT [facility] Provider Note   CSN: [id number] Arrival date & time: 04/01/23  1524     History  Chief Complaint  Patient presents with   Chest Pain   Shortness of Breath    Joshua [name] is a 43 y.o. 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  He reports it is worse with exertion, he endorses some nausea, denies vomiting.  Reports symptoms ongoing for around 2 days but worsened today.  He reports pain 8/10.  Denies history of hypertension, hyperlipidemia, diabetes, tobacco abuse.   Chest Pain Associated symptoms: shortness of breath   Shortness of Breath Associated symptoms: chest pain        Home Medications Prior to Admission medications   Medication Sig Start Date End Date Taking? Authorizing Provider  ARIPiprazole (ABILIFY) 20 MG tablet Take 20 mg by mouth at bedtime. 01/27/22  Yes [provider]  colchicine 0.6 MG tablet Take 1 tablet (0.6 mg total) by mouth daily. Patient taking differently: Take 0.6 mg by mouth every evening. 03/21/23  Yes Al Decant, PA-C  esomeprazole (NEXIUM) 20 MG capsule Take 20 mg by mouth every evening. 02/11/21  Yes [provider]  metoprolol succinate (TOPROL-XL) 25 MG 24 hr tablet Take 1 tablet (25 mg total) by mouth daily. Patient taking differently: Take 25 mg by mouth every evening. 03/21/23  Yes Al Decant, PA-C  ondansetron (ZOFRAN-ODT) 4 MG disintegrating tablet Take 1 tablet (4 mg total) by mouth every 6 (six) hours as needed for nausea or vomiting. 04/01/23  Yes Anikka Marsan H, PA-C  rizatriptan (MAXALT-MLT) 10 MG disintegrating tablet Take 10 mg by mouth as needed for migraine. 12/12/21   Yes [provider]  tadalafil (CIALIS) 5 MG tablet Take 5 mg by mouth every evening.   Yes [provider]  nitroGLYCERIN (NITROSTAT) 0.4 MG SL tablet Place 1 tablet (0.4 mg total) under the tongue every 5 (five) minutes as needed for chest pain. 03/21/22   Alver Sorrow, NP      Allergies    Patient has no known allergies.    Review of Systems   Review of Systems  Respiratory:  Positive for shortness of breath.   Cardiovascular:  Positive for chest pain.  All other systems reviewed and are negative.   Physical Exam Updated Vital Signs BP 123/80 (BP Location: Right Arm)   Pulse 90   Temp 97.9 F (36.6 C) (Oral)   Resp 18   Ht 5\' 8"  (1.727 m)   Wt 105.7 kg   SpO2 99%   BMI 35.43 kg/m  Physical Exam Vitals and nursing note reviewed.  Constitutional:      General: He is not in acute distress.    Appearance: Normal appearance.  HENT:     Head: Normocephalic and atraumatic.  Eyes:     General:        Right eye: No discharge.        Left eye: No discharge.  Cardiovascular:     Rate and Rhythm: Regular rhythm. Tachycardia present.     Heart sounds: No murmur heard.    No friction rub. No gallop.  Pulmonary:     Effort: Pulmonary effort is normal.     Breath  sounds: Normal breath sounds.  Chest:     Comments: No significant ttp of chest wall Abdominal:     General: Bowel sounds are normal.     Palpations: Abdomen is soft.  Skin:    General: Skin is warm and dry.     Capillary Refill: Capillary refill takes less than 2 seconds.  Neurological:     Mental Status: He is alert and oriented to person, place, and time.  Psychiatric:        Mood and Affect: Mood normal.        Behavior: Behavior normal.     ED Results / Procedures / Treatments   Labs (all labs ordered are listed, but only abnormal results are displayed) Labs Reviewed  BASIC METABOLIC PANEL - Abnormal; Notable for the following components:      Result Value   BUN 22 (*)     Calcium 8.7 (*)    All other components within normal limits  CBC  I-STAT CG4 LACTIC ACID, ED  I-STAT CG4 LACTIC ACID, ED  TROPONIN I (HIGH SENSITIVITY)  TROPONIN I (HIGH SENSITIVITY)    EKG None  Radiology DG Chest 2 View Result Date: 04/01/2023 CLINICAL DATA:  Chest pain. EXAM: CHEST - 2 VIEW COMPARISON:  CT chest dated March 21, 2023. Chest x-ray dated June 20, 2022. FINDINGS: The heart size and mediastinal contours are within normal limits. Normal pulmonary vascularity. No focal consolidation, pleural effusion, or pneumothorax. No acute osseous abnormality. IMPRESSION: No active cardiopulmonary disease. Electronically Signed   By: Obie Dredge M.D.   On: 04/01/2023 16:56    Procedures Procedures    Medications Ordered in ED Medications  sodium chloride 0.9 % bolus 1,000 mL (1,000 mLs Intravenous New Bag/Given 04/01/23 1823)  morphine (PF) 4 MG/ML injection 4 mg (4 mg Intravenous Given 04/01/23 1823)  ketorolac (TORADOL) 15 MG/ML injection 15 mg (15 mg Intravenous Given 04/01/23 1822)  ondansetron (ZOFRAN) injection 4 mg (4 mg Intravenous Given 04/01/23 1822)    ED Course/ Medical Decision Making/ A&P                                 Medical Decision Making Amount and/or Complexity of Data Reviewed Labs: ordered. Radiology: ordered.  Risk Prescription drug management.   This patient is a 43 y.o. male  who presents to the ED for concern of chest pain, shortness of breath.   Differential diagnoses prior to evaluation: The emergent differential diagnosis includes, but is not limited to,  ACS, AAS, PE, Mallory-Weiss, Boerhaave's, Pneumonia, acute bronchitis, asthma or COPD exacerbation, anxiety, MSK pain or traumatic injury to the chest, acid reflux versus other . This is not an exhaustive differential.   Past Medical History / Co-morbidities / Social History: schizophrenia, CRVO of right eye, myopericarditis first episode around 2 years ago with multiple flares since  then, history of schizophrenia, GERD, anxiety  Additional history: Chart reviewed. Pertinent results include: If he lab work, imaging, previous emergency room visits, outpatient cardiology visits  Physical Exam: Physical exam performed. The pertinent findings include: Initially quite tachycardic, heart rate around 123, vital signs otherwise stable, normotensive on recheck, blood pressure 123/80 although mild hypertension on arrival, blood pressure 131/92.  He has maintained stable oxygen saturation on room air.  On reevaluation after rehydration no ongoing tachycardia.  Lab Tests/Imaging studies: I personally interpreted labs/imaging and the pertinent results include:  .  CBC unremarkable, initial lactic acid normal,  troponin shows 17, within normal limits, BMP shows unremarkable other than mild hypocalcemia, calcium 8.7, BUN 22, independently interpreted plain film chest x-ray which shows no evidence of acute intrathoracic abnormality.I agree with the radiologist interpretation.  Cardiac monitoring: EKG obtained and interpreted by myself and attending physician which shows: Sinus tachycardia, no acute ST-T changes.   Medications: I ordered medication including morphine, Toradol, Zofran, fluid bolus for nausea, pain, suspected dehydration.  I have reviewed the patients home medicines and have made adjustments as needed.   Disposition: After consideration of the diagnostic results and the patients response to treatment, I feel that patient ultimately stable for discharge with plan as above.   emergency department workup does not suggest an emergent condition requiring admission or immediate intervention beyond what has been performed at this time. The plan is: as above. The patient is safe for discharge and has been instructed to return immediately for worsening symptoms, change in symptoms or any other concerns.  Final Clinical Impression(s) / ED Diagnoses Final diagnoses:  Chest pain,  unspecified type    Rx / DC Orders ED Discharge Orders          Ordered    ondansetron (ZOFRAN-ODT) 4 MG disintegrating tablet  Every 6 hours PRN        04/01/23 2129              [place] 04/01/23 2129    Vanetta Mulders, MD 04/01/23 2311

## 2023-04-01 NOTE — ED Triage Notes (Signed)
 Pt c/o int left sided chest pain that radiates to left shoulder, SOB, and nauseax2d. PT able to speak in complete sentences.

## 2023-04-01 NOTE — Telephone Encounter (Signed)
 Pt calling to advise that he is now at ED

## 2023-04-08 ENCOUNTER — Other Ambulatory Visit: Payer: Self-pay | Admitting: Cardiology

## 2023-04-08 MED ORDER — METOPROLOL SUCCINATE ER 25 MG PO TB24: 25.0000 mg | ORAL_TABLET | Freq: Every day | ORAL | 3 refills | Status: DC

## 2023-04-08 NOTE — Telephone Encounter (Signed)
 Pt is requesting a refill on medication colchicine. Would Dr. Duke Salvia like to refill this non cardiac medication? Please address

## 2023-04-08 NOTE — Telephone Encounter (Signed)
*  STAT* If patient is at the pharmacy, call can be transferred to refill team.   1. Which medications need to be refilled? (please list name of each medication and dose if known) colchicine 0.6 MG tablet  metoprolol succinate (TOPROL-XL) 25 MG 24 hr tablet   2. Would you like to learn more about the convenience, safety, & potential cost savings by using the [facility]? No    3. Are you open to using the [facility] (Type [facility].) No   4. Which pharmacy/location (including street and city if local pharmacy) is medication to be sent to? [facility] - [place], [place] - [address]    5. Do they need a 30 day or 90 day supply? 30 day

## 2023-04-08 NOTE — Telephone Encounter (Signed)
 Colchicine filled 2 weeks ago, too soon for refill  Patient has follow up appointment 4/11, can be discussed then

## 2023-04-10 ENCOUNTER — Telehealth: Payer: Self-pay | Admitting: Cardiology

## 2023-04-10 NOTE — Telephone Encounter (Signed)
 Patients calling to see if forms are ready to be pick up. Please advise

## 2023-04-10 NOTE — Telephone Encounter (Signed)
 Left vm to refer to his MyChart message as his forms are in [place] and ready to be picked up.

## 2023-04-13 ENCOUNTER — Telehealth (HOSPITAL_BASED_OUTPATIENT_CLINIC_OR_DEPARTMENT_OTHER): Payer: Self-pay | Admitting: *Deleted

## 2023-04-13 ENCOUNTER — Encounter (HOSPITAL_COMMUNITY): Payer: Self-pay

## 2023-04-13 ENCOUNTER — Telehealth: Payer: Self-pay | Admitting: Cardiovascular Disease

## 2023-04-13 DIAGNOSIS — H34811 Central retinal vein occlusion, right eye, with macular edema: Secondary | ICD-10-CM | POA: Diagnosis not present

## 2023-04-13 DIAGNOSIS — H43822 Vitreomacular adhesion, left eye: Secondary | ICD-10-CM | POA: Diagnosis not present

## 2023-04-13 DIAGNOSIS — H35033 Hypertensive retinopathy, bilateral: Secondary | ICD-10-CM | POA: Diagnosis not present

## 2023-04-13 MED ORDER — COLCHICINE 0.6 MG PO TABS
0.6000 mg | ORAL_TABLET | Freq: Every day | ORAL | 0 refills | Status: DC
Start: 2023-04-13 — End: 2023-04-13

## 2023-04-13 MED ORDER — COLCHICINE 0.6 MG PO TABS: 0.6000 mg | ORAL_TABLET | Freq: Every day | ORAL | 0 refills | Status: DC

## 2023-04-13 NOTE — Addendum Note (Signed)
 Addended by: Marlene Lard on: 04/13/2023 05:03 PM   Modules accepted: Orders

## 2023-04-13 NOTE — Telephone Encounter (Signed)
 Pt. Last seen by Wallis Bamberg, please advise

## 2023-04-13 NOTE — Addendum Note (Signed)
 Addended by: Regis Bill B on: 04/13/2023 05:03 PM   Modules accepted: Orders

## 2023-04-13 NOTE — Telephone Encounter (Signed)
 Pt c/o medication issue:  1. Name of Medication: colchicine 0.6 MG tablet [[id number]]   2. How are you currently taking this medication (dosage and times per day)?   3. Are you having a reaction (difficulty breathing--STAT)? No   4. What is your medication issue?  Pt stated he received this med in the emergency room and he is not going to have enough to make it to his appt on 4/15.  He is not sure what should do. He is not sure if he is suppose to keep taking it our Taper off of it?    Best best  [phone]

## 2023-04-13 NOTE — Telephone Encounter (Addendum)
 Patient brought by forms to be filled out until he can go back to work next week Also wanted to know what to do regarding Colchicine, will run out few days prior to follow up next week Per patient he has not had any chest pains since ED visit in March  Discussed with [facility], continue Colchicine and discuss further at follow up next week.  Rx sent to [facility]   Forms completed by [facility] however there is one place patient needs to sign   Tried to call patient and number listed does not match vm number  Mychart message sent to patient, he has read   Forms up front for pick up and place for him to sign

## 2023-04-15 ENCOUNTER — Other Ambulatory Visit: Payer: Self-pay | Admitting: Cardiology

## 2023-04-15 ENCOUNTER — Ambulatory Visit (HOSPITAL_COMMUNITY)
Admission: RE | Admit: 2023-04-15 | Discharge: 2023-04-15 | Disposition: A | Discharge status: Home / Self Care | Source: Ambulatory Visit | Attending: Cardiology | Admitting: Cardiology

## 2023-04-15 DIAGNOSIS — I319 Disease of pericardium, unspecified: Secondary | ICD-10-CM | POA: Diagnosis not present

## 2023-04-15 MED ORDER — GADOBUTROL 1 MMOL/ML IV SOLN
10.0000 mL | Freq: Once | INTRAVENOUS | Status: AC | PRN
  Administered 2023-04-15: 10 mL via INTRAVENOUS

## 2023-04-16 DIAGNOSIS — R002 Palpitations: Secondary | ICD-10-CM | POA: Diagnosis not present

## 2023-04-17 ENCOUNTER — Ambulatory Visit: Admitting: Cardiology

## 2023-04-21 ENCOUNTER — Ambulatory Visit (HOSPITAL_BASED_OUTPATIENT_CLINIC_OR_DEPARTMENT_OTHER): Admitting: Family

## 2023-04-21 ENCOUNTER — Encounter (HOSPITAL_BASED_OUTPATIENT_CLINIC_OR_DEPARTMENT_OTHER): Payer: Self-pay | Admitting: Family

## 2023-04-21 ENCOUNTER — Ambulatory Visit (HOSPITAL_COMMUNITY)

## 2023-04-21 VITALS — BP 114/82 | HR 83 | Ht 68.0 in | Wt 234.2 lb

## 2023-04-21 DIAGNOSIS — I319 Disease of pericardium, unspecified: Secondary | ICD-10-CM | POA: Diagnosis not present

## 2023-04-21 NOTE — Progress Notes (Signed)
 Cardiology Office Note:  .   Date:  04/21/2023  ID:  Joshua [name], DOB April 09, 1980, MRN [id number] PCP: Sophie Dutch, MD  [facility] Providers Cardiologist:  Maudine Sos, MD    History of Present Illness: .   Joshua [name] is a 43 y.o. male with a hx of schizophrenia, central retinal vein occlusion, myopericarditis, syncope.  Admitted 05/06/2021 with syncope.  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  Second while driving his car.  Cardiac enzymes elevated.  Cardiac catheterization outside hospital reportedly showed normal coronary arteries.  EKG inferolateral and anterior ST elevations.  Echo at outside hospital unremarkable.  Suspicious for myocarditis.  Cardiac MRI LVEF 52% with mild mid to basal anterolateral and inferolateral hypokinesis.  There was late gadolinium millimeter enhancement consistent with myopericarditis.  He was started on colchicine  and metoprolol .  Monitor revealed predominantly normal sinus rhythm, rare PAC/PVC, 6 beat NSVT.  At follow up 05/20/21 he was recommended to continue colchicine  for a total of 3 months.  However after discontinuation he re-presented to the ED 08/30/2021 with chest discomfort.  CRP 1.8, sed rate 3, BNP 3.8. Colchicine  and Metoprolol  were resumed.  At follow-up 09/2021 colchicine  taper planned. Seen 12/19/21 by Dr. Theodis [name] taking colchicine  every other day with no chest pain. Saw pharmacy team 01/17/22 to discuss Arcolyst. Talked with pharmacy team 02/19/22 and opted not to pursue Arcolyst.    Seen 03/20/22 with no chest pain in >1 month while off of Colchicine , Metoprolol .    ED visit 04/19/2022 for chest pain.  EKG revealed sinus tachycardia.  CTA and troponin unremarkable.  Pain described as different than prior myocarditis pain. He was discharged on Celebrex  200 mg twice a day for 10 days and Pepcid  20 mg twice a day.  Sed rate/CRP at follow up visit 04/29/23 were normal, celebrex  tapered.    ED  visit 03/21/23 with chest pain. Troponin 18 ? 16. CTA negative for PE. Discharged on Colchicine  0.6mg  daily, Toprol  25mg  daily.  Discussed the use of AI scribe software for clinical note transcription with the patient, who gave verbal consent to proceed. At clinic visit 03/27/23 sed rate 2, CRP 2.  Cardiac MRI 04/15/2023 with normal LV size and function LVEF 63%, normal RV, not suggestive of acute myocarditis but could be consistent with prior myocarditis.  Monitor 04/16/2023 predominantly normal sinus rhythm, average heart rate 87 bpm, no significant arrhythmias nor pauses.  History of Present Illness Feeling well since last seen. Some diarrhea on colchicine  but continues to take once per day, he is considering weaning off of it. He reports no current chest pain or lightheadedness. Reviewed MRI, reassurance provided. 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 Reviewed safety protocol. The patient also mentions taking hydroxyzine  for anxiety and has stopped taking it prior to ED, which he believes may have contributed to a recent episode of chest pain. Has since resumed.    ROS: Please see the history of present illness.    All other systems reviewed and are negative.   Studies Reviewed: .        Cardiac Studies & Procedures   ______________________________________________________________________________________________        Joshua [name]  LONG TERM MONITOR-LIVE TELEMETRY (3-14 DAYS) 05/16/2021  Narrative 5 day Zio Monitor  Quality: Fair.  Baseline artifact. Predominant rhythm: sinus rhtyhm Average heart rate: 79 bpm Max heart rate: 143 bpm Min heart rate: 49 bpm Pauses >  2.5 seconds: None  Rare PACs and PVCs. 6 beats NSVT   Joshua C. Theodis Fiscal, MD, [facility] 06/06/2021 11:27 AM     CARDIAC MRI  MR CARDIAC MORPHOLOGY W WO CONTRAST 04/15/2023  Narrative CLINICAL DATA:  History of  myocarditis  EXAM: CARDIAC MRI  TECHNIQUE: The patient was scanned on a 1.5 Tesla GE magnet. A dedicated cardiac coil was used. Functional imaging was done using Fiesta sequences. 2,3, and 4 chamber views were done to assess for RWMA's. Modified Simpson's rule using a short axis stack was used to calculate an ejection fraction on a dedicated work [employer]. The patient received 10 cc of Gadavist . After 10 minutes inversion recovery sequences were used to assess for infiltration and scar tissue.  FINDINGS: Limited images of the lung fields showed no gross abnormalities.  Normal left ventricular size and wall thickness. Normal wall motion, LV EF 63%. Normal right ventricular size and systolic function, RV EF 53%. Normal left and right atrial sizes. Trileaflet aortic valve with no stenosis and trivial regurgitation (regurgitant fraction 4%).  Delayed enhancement imaging: Mid-wall late gadolinium enhancement (LGE) in the basal anterolateral and basal inferolateral walls. Small area of mid-wall LGE in the apical anterior wall. The pericardium was not enhanced.  MEASUREMENTS: MEASUREMENTS LVEDV 144 mL  LVEDVi 64 mL/m2  LVSV 91 mL LVEF 63%  RVEDV 157 mL  RVEDVi 70 mL/m2 RVSV 83 mL RVEF 53%  Global T1 1041, ECV 31%  Global T2 48  Aortic forward volume 81 mL  Aortic regurgitant fraction 4%  IMPRESSION: 1.  Normal LV size and systolic function with LV EF 63%.  2.  Normal RV size and systolic function, RV EF 53%.  3. There was a non-coronary pattern of LGE present in the basal lateral wall and in the apical anterior wall.  4.  Normal global and regional T2.  5. Mildly elevated extracellular volume percentage at 31% (suggesting increased myocardial fibrotic content).  This study is not suggestive of acute myocarditis by modified [facility] criteria (no T2 abnomality). However, it could be consistent with prior myocarditis (LGE pattern is  consistent with prior myocarditis).  Joshua [name]   Electronically Signed By: Peder Bourdon M.D. On: 04/15/2023 17:56   ______________________________________________________________________________________________      Risk Assessment/Calculations:             Physical Exam:   VS:  BP 114/82   Pulse 83   Ht 5\' 8"  (1.727 m)   Wt 234 lb 3.2 oz (106.2 kg)   SpO2 100%   BMI 35.61 kg/m    Wt Readings from Last 3 Encounters:  04/21/23 234 lb 3.2 oz (106.2 kg)  04/01/23 233 lb 0.4 oz (105.7 kg)  03/27/23 233 lb (105.7 kg)    GEN: Well nourished, well developed in no acute distress NECK: No JVD; No carotid bruits CARDIAC: RRR, no murmurs, rubs, gallops RESPIRATORY:  Clear to auscultation without rales, wheezing or rhonchi  ABDOMEN: Soft, non-tender, non-distended EXTREMITIES:  No edema; No deformity   ASSESSMENT AND PLAN: .    Assessment and Plan Assessment & Plan Myocarditis No active myocarditis on MRI, normal heart function. Colchicine  causing significant diarrhea. Discussed tapering colchicine  due to resolved inflammation. Addressed concerns about Arcalyst side effects.Most recent episode more reflective of anxiety related chest pain due to normal sed rate/CRP.  - Taper colchicine  (every other day x 2 weeks, 2x per week x 2 weeks, weekly x 2 week, discontinue) monitor for chest pain recurrence. - Provide  Arcalyst information for review for future consideration - If chest pain recurs, perform sedimentation rate and CRP tests.  Anxiety Anxiety linked to increased heart rate and stress. Hydroxyzine  effective for anxiety and sleep, preferred for non-addictive properties. - Continue hydroxyzine  at night per primary care team.  Exercise and Weight Management Interested in exercise for weight management. Discussed treadmill and elliptical options, starting with walking. - Start walking on treadmill at 3.5 mph, gradually increase incline. - Consider elliptical for  joint-friendly exercise.  Return to Work Preparing to return to work full-time, no physical ability concerns. - Complete return to work paperwork for April 22, 2023. - Provide MRI results for employer's medical evaluator         Dispo: follow up in 6 mos  Signed, Clearnce Curia, NP

## 2023-04-21 NOTE — Patient Instructions (Addendum)
 Medication Instructions:  Your physician has recommended you make the following change in your medication:   Colchine taper: Take Colchine once every other day for 2 weeks THEN Monday and Thursday for 2 weeks THEN once for the last week THEN stop.  *If you need a refill on your cardiac medications before your next appointment, please call your pharmacy*  Follow-Up: At [facility], you and your health needs are our priority.  As part of our continuing mission to provide you with exceptional heart care, our providers are all part of one team.  This team includes your primary Cardiologist (physician) and Advanced Practice Providers or APPs (Physician Assistants and Nurse Practitioners) who all work together to provide you with the care you need, when you need it.  Your next appointment:   6 month(s)  Provider:   Maudine Sos, MD or Neomi Banks, NP    We recommend signing up for the patient portal called "MyChart".  Sign up information is provided on this After Visit Summary.  MyChart is used to connect with patients for Virtual Visits (Telemedicine).  Patients are able to view lab/test results, encounter notes, upcoming appointments, etc.  Non-urgent messages can be sent to your provider as well.   To learn more about what you can do with MyChart, go to [email or web address].

## 2023-04-24 ENCOUNTER — Encounter (HOSPITAL_BASED_OUTPATIENT_CLINIC_OR_DEPARTMENT_OTHER): Payer: Self-pay | Admitting: Family

## 2023-04-24 ENCOUNTER — Ambulatory Visit (HOSPITAL_BASED_OUTPATIENT_CLINIC_OR_DEPARTMENT_OTHER): Admitting: Family

## 2023-04-30 ENCOUNTER — Ambulatory Visit (HOSPITAL_BASED_OUTPATIENT_CLINIC_OR_DEPARTMENT_OTHER): Admitting: Nurse Practitioner

## 2023-05-04 ENCOUNTER — Telehealth: Payer: Self-pay | Admitting: Family

## 2023-05-04 NOTE — Telephone Encounter (Signed)
Mychart message sent as requested.

## 2023-05-04 NOTE — Telephone Encounter (Signed)
 Once he has been off Colchicine  for 1-2 weeks with no recurrent chest pain, may trial off Metoprolol .   Keiondra Brookover S Hameed Kolar, NP

## 2023-05-04 NOTE — Telephone Encounter (Signed)
 Spoke with patient regarding the Colchicine , he lost his AVS and requested the instructions be sent in mychart He was also asking about the Metoprolol  and if he should continue Advised patient looks like he is to continue Metoprolol  but he requested Jerrold Morgan review, will forward to her

## 2023-05-04 NOTE — Telephone Encounter (Signed)
 Caitlin was taking him of some medication and pt cannot remember how he was suppose to be doing that.

## 2023-05-25 DIAGNOSIS — F419 Anxiety disorder, unspecified: Secondary | ICD-10-CM | POA: Diagnosis not present

## 2023-05-25 DIAGNOSIS — Z0000 Encounter for general adult medical examination without abnormal findings: Secondary | ICD-10-CM | POA: Diagnosis not present

## 2023-05-25 DIAGNOSIS — F200 Paranoid schizophrenia: Secondary | ICD-10-CM | POA: Diagnosis not present

## 2023-05-25 DIAGNOSIS — E782 Mixed hyperlipidemia: Secondary | ICD-10-CM | POA: Diagnosis not present

## 2023-05-25 DIAGNOSIS — K219 Gastro-esophageal reflux disease without esophagitis: Secondary | ICD-10-CM | POA: Diagnosis not present

## 2023-08-26 ENCOUNTER — Encounter (HOSPITAL_BASED_OUTPATIENT_CLINIC_OR_DEPARTMENT_OTHER): Payer: Self-pay

## 2023-08-26 ENCOUNTER — Telehealth (HOSPITAL_BASED_OUTPATIENT_CLINIC_OR_DEPARTMENT_OTHER): Payer: Self-pay | Admitting: Cardiovascular Disease

## 2023-08-26 NOTE — Telephone Encounter (Signed)
See phone note 8/20

## 2023-08-26 NOTE — Telephone Encounter (Signed)
 Patient stated he wants a call back to discuss his CRP test results.

## 2023-08-26 NOTE — Telephone Encounter (Signed)
 Spoke with patient and advised Reche out of the office but will forward to her for when she returns tomorrow.

## 2023-08-26 NOTE — Telephone Encounter (Signed)
 Pt called and I'm unable to see the lab results he was referencing. After hearing him name drop providers I realized this note was sent to the wrong triage. Pt asked to wait for [facility] office to call him back. Message sent to back to triage.

## 2023-08-27 NOTE — Telephone Encounter (Signed)
 Labs via PCP reviewed CRP 2.7 with normal being <1. This is mildly elevated. Please call to inquire if he is having any chest pain. If no chest pain, not of concern. We had previously discussed Arcolyst for recurrent pericarditis. If he is having recurrent chest pain symptoms, would need to consider.   Daesha Insco S Illa Enlow, NP

## 2023-08-27 NOTE — Telephone Encounter (Signed)
 Mychart message sent to patient.

## 2023-09-09 ENCOUNTER — Other Ambulatory Visit: Payer: Self-pay

## 2023-09-09 ENCOUNTER — Emergency Department (HOSPITAL_COMMUNITY)

## 2023-09-09 ENCOUNTER — Encounter (HOSPITAL_COMMUNITY): Payer: Self-pay

## 2023-09-09 ENCOUNTER — Emergency Department (HOSPITAL_COMMUNITY)
Admission: EM | Admit: 2023-09-09 | Discharge: 2023-09-09 | Disposition: A | Discharge status: Home / Self Care | Attending: Emergency Medicine | Admitting: Emergency Medicine

## 2023-09-09 DIAGNOSIS — I514 Myocarditis, unspecified: Secondary | ICD-10-CM | POA: Diagnosis not present

## 2023-09-09 DIAGNOSIS — R079 Chest pain, unspecified: Secondary | ICD-10-CM | POA: Diagnosis present

## 2023-09-09 LAB — CBC WITH DIFFERENTIAL/PLATELET
Abs Immature Granulocytes: 0.02 K/uL (ref 0.00–0.07)
Basophils Absolute: 0.1 K/uL (ref 0.0–0.1)
Basophils Relative: 1 %
Eosinophils Absolute: 0.1 K/uL (ref 0.0–0.5)
Eosinophils Relative: 1 %
HCT: 44.1 % (ref 39.0–52.0)
Hemoglobin: 15.5 g/dL (ref 13.0–17.0)
Immature Granulocytes: 0 %
Lymphocytes Relative: 27 %
Lymphs Abs: 2.5 K/uL (ref 0.7–4.0)
MCH: 28.8 pg (ref 26.0–34.0)
MCHC: 35.1 g/dL (ref 30.0–36.0)
MCV: 81.8 fL (ref 80.0–100.0)
Monocytes Absolute: 0.6 K/uL (ref 0.1–1.0)
Monocytes Relative: 7 %
Neutro Abs: 6.1 K/uL (ref 1.7–7.7)
Neutrophils Relative %: 64 %
Platelets: 342 K/uL (ref 150–400)
RBC: 5.39 MIL/uL (ref 4.22–5.81)
RDW: 14.1 % (ref 11.5–15.5)
WBC: 9.4 K/uL (ref 4.0–10.5)
nRBC: 0 % (ref 0.0–0.2)

## 2023-09-09 LAB — I-STAT CHEM 8, ED
BUN: 19 mg/dL (ref 6–20)
Calcium, Ion: 1.15 mmol/L (ref 1.15–1.40)
Chloride: 108 mmol/L (ref 98–111)
Creatinine, Ser: 1.1 mg/dL (ref 0.61–1.24)
Glucose, Bld: 87 mg/dL (ref 70–99)
HCT: 46 % (ref 39.0–52.0)
Hemoglobin: 15.6 g/dL (ref 13.0–17.0)
Potassium: 4.1 mmol/L (ref 3.5–5.1)
Sodium: 142 mmol/L (ref 135–145)
TCO2: 24 mmol/L (ref 22–32)

## 2023-09-09 LAB — D-DIMER, QUANTITATIVE: D-Dimer, Quant: <0.27 ug{FEU}/mL (ref 0.00–0.50)

## 2023-09-09 LAB — BASIC METABOLIC PANEL WITH GFR
Anion gap: 11 (ref 5–15)
BUN: 17 mg/dL (ref 6–20)
CO2: 24 mmol/L (ref 22–32)
Calcium: 9.5 mg/dL (ref 8.9–10.3)
Chloride: 106 mmol/L (ref 98–111)
Creatinine, Ser: 1.14 mg/dL (ref 0.61–1.24)
GFR, Estimated: >60 mL/min (ref >60)
Glucose, Bld: 89 mg/dL (ref 70–99)
Potassium: 4.4 mmol/L (ref 3.5–5.1)
Sodium: 141 mmol/L (ref 135–145)

## 2023-09-09 LAB — TROPONIN I (HIGH SENSITIVITY)
Troponin I (High Sensitivity): 27 ng/L — ABNORMAL HIGH (ref <18)
Troponin I (High Sensitivity): 26 ng/L — ABNORMAL HIGH (ref <18)

## 2023-09-09 LAB — C-REACTIVE PROTEIN: CRP: 1.6 mg/dL — ABNORMAL HIGH (ref <1.0)

## 2023-09-09 LAB — CBG MONITORING, ED: Glucose-Capillary: 100 mg/dL — ABNORMAL HIGH (ref 70–99)

## 2023-09-09 MED ORDER — COLCHICINE 0.6 MG PO TABS: 0.6000 mg | ORAL_TABLET | Freq: Every day | ORAL | 0 refills | Status: DC

## 2023-09-09 MED ORDER — ASPIRIN 81 MG PO CHEW
324.0000 mg | CHEWABLE_TABLET | Freq: Once | ORAL | Status: AC
  Administered 2023-09-09: 324 mg via ORAL
  Filled 2023-09-09: qty 4

## 2023-09-09 MED ORDER — COLCHICINE 0.6 MG PO TABS
1.2000 mg | ORAL_TABLET | ORAL | Status: AC
  Administered 2023-09-09: 1.2 mg via ORAL
  Filled 2023-09-09: qty 2

## 2023-09-09 NOTE — ED Provider Notes (Signed)
 [facility] AT [facility] Provider Note   CSN: [id number] Arrival date & time: 09/09/23  1645     Patient presents with: Chest Pain   Joshua [name] is a 43 y.o. male.   Pt is a 43 yo male with pmh of myocarditis presenting for left sided chest pain that was sharp in nature that radiated straight to the back, 10/10 pain severity, and associated with sob. Pt was standing shaving when symptoms began. Pt states it took several hours for pain to start to improve. Symptoms are worse with movement. States he had one episode of coughing while in the ED but otherwise no coughing at home or now. Denies fever, chills, rhinorrhea, nasal congestions, or other infectious signs or symptoms. States he was dx with myocarditis twice previously. First dx in 2023, 1-3 months after coming home from a trip to [place].   Please see patient's cardiologist's, Dr. Annabella Scarce, note on 04/21/2023 for detailed rundown of recent diagnosis and visits.  The history is provided by the patient. No language interpreter was used.  Chest Pain Associated symptoms: shortness of breath   Associated symptoms: no abdominal pain, no back pain, no cough, no fever, no palpitations and no vomiting        Prior to Admission medications   Medication Sig Start Date End Date Taking? Authorizing Provider  colchicine  0.6 MG tablet Take 1 tablet (0.6 mg total) by mouth daily. 09/09/23 10/09/23 Yes Joshua Hila P, DO  ARIPiprazole  (ABILIFY ) 20 MG tablet Take 20 mg by mouth at bedtime. 01/27/22   [provider]  esomeprazole (NEXIUM) 20 MG capsule Take 20 mg by mouth every evening. 02/11/21   [provider]  metoprolol  succinate (TOPROL -XL) 25 MG 24 hr tablet Take 1 tablet (25 mg total) by mouth daily. 04/08/23   Joshua Delon [name], NP  nitroGLYCERIN  (NITROSTAT ) 0.4 MG SL tablet Place 1 tablet (0.4 mg total) under the tongue every 5 (five) minutes as needed for chest pain. Patient not  taking: Reported on 04/21/2023 03/21/22   Joshua Reche RAMAN, NP  ondansetron  (ZOFRAN -ODT) 4 MG disintegrating tablet Take 1 tablet (4 mg total) by mouth every 6 (six) hours as needed for nausea or vomiting. Patient not taking: Reported on 04/21/2023 04/01/23   Prosperi, Christian H, PA-C  rizatriptan (MAXALT-MLT) 10 MG disintegrating tablet Take 10 mg by mouth as needed for migraine. 12/12/21   [provider]  tadalafil (CIALIS) 5 MG tablet Take 5 mg by mouth every evening. Patient not taking: Reported on 04/21/2023    [provider]    Allergies: Doxycycline    Review of Systems  Constitutional:  Negative for chills and fever.  HENT:  Negative for ear pain and sore throat.   Eyes:  Negative for pain and visual disturbance.  Respiratory:  Positive for shortness of breath. Negative for cough.   Cardiovascular:  Positive for chest pain. Negative for palpitations.  Gastrointestinal:  Negative for abdominal pain and vomiting.  Genitourinary:  Negative for dysuria and hematuria.  Musculoskeletal:  Negative for arthralgias and back pain.  Skin:  Negative for color change and rash.  Neurological:  Negative for seizures and syncope.  All other systems reviewed and are negative.   Updated Vital Signs BP 115/68   Pulse 77   Temp 98.6 F (37 C)   Resp 16   Ht 5' 8 (1.727 m)   Wt 97.5 kg   SpO2 97%   BMI 32.69 kg/m  Physical Exam Vitals and nursing note reviewed.  Constitutional:      General: He is not in acute distress.    Appearance: He is well-developed.  HENT:     Head: Normocephalic and atraumatic.  Eyes:     Conjunctiva/sclera: Conjunctivae normal.  Cardiovascular:     Rate and Rhythm: Normal rate and regular rhythm.     Heart sounds: No murmur heard. Pulmonary:     Effort: Pulmonary effort is normal. No respiratory distress.     Breath sounds: Normal breath sounds.  Abdominal:     Palpations: Abdomen is soft.     Tenderness: There is no abdominal  tenderness.  Musculoskeletal:        General: No swelling.     Cervical back: Neck supple.  Skin:    General: Skin is warm and dry.     Capillary Refill: Capillary refill takes less than 2 seconds.  Neurological:     Mental Status: He is alert.  Psychiatric:        Mood and Affect: Mood normal.     (all labs ordered are listed, but only abnormal results are displayed) Labs Reviewed  C-REACTIVE PROTEIN - Abnormal; Notable for the following components:      Result Value   CRP 1.6 (*)    All other components within normal limits  CBG MONITORING, ED - Abnormal; Notable for the following components:   Glucose-Capillary 100 (*)    All other components within normal limits  TROPONIN I (HIGH SENSITIVITY) - Abnormal; Notable for the following components:   Troponin I (High Sensitivity) 27 (*)    All other components within normal limits  TROPONIN I (HIGH SENSITIVITY) - Abnormal; Notable for the following components:   Troponin I (High Sensitivity) 26 (*)    All other components within normal limits  BASIC METABOLIC PANEL WITH GFR  CBC WITH DIFFERENTIAL/PLATELET  SEDIMENTATION RATE  D-DIMER, QUANTITATIVE  I-STAT CHEM 8, ED    EKG: EKG Interpretation Date/Time:  Wednesday September 09 2023 16:56:21 EDT Ventricular Rate:  117 PR Interval:  152 QRS Duration:  80 QT Interval:  320 QTC Calculation: 446 R Axis:   -4  Text Interpretation: Sinus tachycardia Otherwise normal ECG When compared with ECG of 01-Apr-2023 15:32, PREVIOUS ECG IS PRESENT Confirmed by Joshua Hila ([id number]) on 09/09/2023 11:07:05 PM  Radiology: DG Chest 1 View Result Date: 09/09/2023 CLINICAL DATA:  Left-sided chest pain. EXAM: CHEST  1 VIEW COMPARISON:  04/01/2023 FINDINGS: The cardiomediastinal contours are stable. The lungs are clear. Pulmonary vasculature is normal. No consolidation, pleural effusion, or pneumothorax. No acute osseous abnormalities are seen. IMPRESSION: No active disease. Electronically Signed    By: Andrea Gasman M.D.   On: 09/09/2023 18:34     Procedures   Medications Ordered in the ED  colchicine  tablet 1.2 mg (has no administration in time range)  aspirin  chewable tablet 324 mg (324 mg Oral Given 09/09/23 1728)                                    Medical Decision Making Amount and/or Complexity of Data Reviewed Labs: ordered.  Risk Prescription drug management.   43 year old male with past medical history of myopericarditis presenting for complaints of chest pain that began today.  Patient is alert and oriented x 3, no acute distress, afebrile, stable vital signs.  Chest pain is not reproducible.  Twelve-lead EKG demonstrates no ST  segment elevation or depression.  Sinus tachycardia only.  No other cardiac arrhythmias at this time.  Troponins are elevated but flat at 27 and 26.  CRP is 1.6.  Sed rate ordered.  D-dimer ordered.  Otherwise laboratory studies are stable.  Chart review demonstrates extensive history taking by patient's cardiologist Dr. Annabella Scarce as seen below: -Admitted 05/06/2021 with syncope.  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  Second while driving his car.  Cardiac enzymes elevated.  Cardiac catheterization outside hospital reportedly showed normal coronary arteries.  EKG inferolateral and anterior ST elevations.  Echo at outside hospital unremarkable.  Suspicious for myocarditis.  Cardiac MRI LVEF 52% with mild mid to basal anterolateral and inferolateral hypokinesis.  There was late gadolinium millimeter enhancement consistent with myopericarditis.  He was started on colchicine  and metoprolol .  Monitor revealed predominantly normal sinus rhythm, rare PAC/PVC, 6 beat NSVT.   -At follow up 05/20/21 he was recommended to continue colchicine  for a total of 3 months.  However after discontinuation he re-presented to the ED 08/30/2021 with chest discomfort.  CRP 1.8, sed rate 3, BNP 3.8. Colchicine  and Metoprolol  were resumed.   At follow-up 09/2021 colchicine  taper planned. Seen 12/19/21 by Dr. Scarce taking colchicine  every other day with no chest pain. Saw pharmacy team 01/17/22 to discuss Arcolyst. Talked with pharmacy team 02/19/22 and opted not to pursue Arcolyst.    -Seen 03/20/22 with no chest pain in >1 month while off of Colchicine , Metoprolol .    -ED visit 04/19/2022 for chest pain.  EKG revealed sinus tachycardia.  CTA and troponin unremarkable.  Pain described as different than prior myocarditis pain. He was discharged on Celebrex  200 mg twice a day for 10 days and Pepcid  20 mg twice a day.  Sed rate/CRP at follow up visit 04/29/23 were normal, celebrex  tapered.    -ED visit 03/21/23 with chest pain. Troponin 18 ? 16. CTA negative for PE. Discharged on Colchicine  0.6mg  daily, Toprol  25mg  daily.  Discussed the use of AI scribe software for clinical note transcription with the patient, who gave verbal consent to proceed. At clinic visit 03/27/23 sed rate 2, CRP 2.  Cardiac MRI 04/15/2023 with normal LV size and function LVEF 63%, normal RV, not suggestive of acute myocarditis but could be consistent with prior myocarditis.  Monitor 04/16/2023 predominantly normal sinus rhythm, average heart rate 87 bpm, no significant arrhythmias nor pauses.     I spoke with our on-call cardiology team in regards to treatment options.  They state that often in the time of recurrent myopericarditis the patient is recommended for multiple anti-inflammatory medications, sometimes referred to rheumatology, and other times referred for surgery.  He recommends close follow-up with the patient's cardiology office first thing tomorrow morning.  He states otherwise the patient is safe at this time and does not require admission.  Obvious return precautions.  Recommends starting colchicine  at 1.2 mg today and 0.6 daily.  Prescriptions ordered.  Patient in no distress and overall condition improved here in the ED. Detailed discussions were had with the  patient regarding current findings, and need for close f/u with PCP or on call doctor. The patient has been instructed to return immediately if the symptoms worsen in any way for re-evaluation. Patient verbalized understanding and is in agreement with current care plan. All questions answered prior to discharge.     Final diagnoses:  Recurrent myocarditis    ED Discharge Orders  Ordered    colchicine  0.6 MG tablet  Daily        09/09/23 2337               Joshua Bernarda SQUIBB, DO 09/09/23 2343

## 2023-09-09 NOTE — ED Notes (Signed)
 PT monitor not working CCMD aware and Press photographer. PT vitals show up in the room but unable to place PT on CCMD, or nursing station monitor.

## 2023-09-09 NOTE — ED Provider Triage Note (Signed)
 Emergency Medicine Provider Triage Evaluation Note  Joshua [name] , a 43 y.o. male  was evaluated in triage.  Pt complains of left-sided chest pain, shortness of breath.  Previous history of idiopathic myocarditis.  Was previously on colchicine  for same however has not been taking this recently.  Pain exacerbated with movement and exertion.  Review of Systems  Positive: As above Negative:   Physical Exam  BP 130/89 (BP Location: Right Arm)   Pulse (!) 123   Temp 98.3 F (36.8 C)   Resp 20   Ht 5' 8 (1.727 m)   Wt 97.5 kg   SpO2 96%   BMI 32.69 kg/m  Gen:   Awake, no distress   Resp:  Normal effort  MSK:   Moves extremities without difficulty  Other:    Medical Decision Making  Medically screening exam initiated at 5:15 PM.  Appropriate orders placed.  Joshua [name] was informed that the remainder of the evaluation will be completed by another provider, this initial triage assessment does not replace that evaluation, and the importance of remaining in the ED until their evaluation is complete.  Begin initial chest pain workup secondary to chest pain, possible ACS and also history of myocarditis.  Add on CRP secondary to history of myocarditis.   Joshua [name], Joshua [name] 09/09/23 [phone]

## 2023-09-09 NOTE — Discharge Instructions (Addendum)
 Please call your cardiologist office with Dr. Annabella Scarce first thing tomorrow morning for recurrent myocarditis.  Reinitiate colchicine  treatment.

## 2023-09-09 NOTE — ED Triage Notes (Signed)
 C/O left sided chest pain that started a couple of hours ago, shob. Denies n/v.

## 2023-09-09 NOTE — ED Triage Notes (Signed)
 Patient c/o sharp chest pain around 2 hrs ago that is now a dull, constant ache, reports 2 weeks ago at annual visit CRP was elevated at 2.7. Patient reports SHOB sometimes but not right now.

## 2023-09-10 ENCOUNTER — Other Ambulatory Visit: Payer: Self-pay | Admitting: Cardiovascular Disease

## 2023-09-10 LAB — SEDIMENTATION RATE: Sed Rate: 1 mm/h (ref 0–16)

## 2023-09-10 MED ORDER — METOPROLOL SUCCINATE ER 25 MG PO TB24: 25.0000 mg | ORAL_TABLET | Freq: Every day | ORAL | 2 refills | Status: DC

## 2023-09-10 NOTE — Telephone Encounter (Signed)
 Pt is calling requesting a refill on non cardiac medication colchicine . Would Reche Finder, NP like to refill this non cardiac medication? Please address

## 2023-09-10 NOTE — Telephone Encounter (Signed)
 Recommend call pharmacy to confirm he was able to pick up the Rx that was sent by ED provider. Further refills can be addressed at his upcoming visit 09/14/23.   Angelize Ryce S Sania Noy, NP

## 2023-09-10 NOTE — Telephone Encounter (Signed)
*  STAT* If patient is at the pharmacy, call can be transferred to refill team.   1. Which medications need to be refilled? (please list name of each medication and dose if known) colchicine  0.6 MG tablet  metoprolol  succinate (TOPROL -XL) 25 MG 24 hr tablet   2. Would you like to learn more about the convenience, safety, & potential cost savings by using the [facility]?     3. Are you open to using the [facility] (Type [facility]. ).   4. Which pharmacy/location (including street and city if local pharmacy) is medication to be sent to? [facility] - [place], [place] - [address]    5. Do they need a 30 day or 90 day supply? 90 day

## 2023-09-12 NOTE — Progress Notes (Signed)
 Cardiology Office Note   Date:  09/14/2023  ID:  Joshua [name], DOB 1980-07-29, MRN [id number] PCP: Joshua Lynwood RAMAN, MD  [facility] Providers Cardiologist:  Annabella Scarce, MD     [facility] Myopericarditis Syncope Central retinal vein occlusion Schizophrenia Normal coronary arteries on cath at outside hospital 05/2021.  Admission 05/2021 with syncope.  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  Cardiac enzymes were elevated.  EKG showed inferolateral and anterior ST elevations.  Cardiac catheterization at an outside hospital showed normal coronary arteries.  No acute abnormalities on echo.  Suspicious for myocarditis.  MRI with LVEF 52% with mild mid to basal anterolateral and inferolateral hypokinesis.  There was late gadolinium millimeter enhancement consistent with myopericarditis.  He was started on colchicine  and metoprolol .  Monitor revealed predominant normal sinus rhythm, rare PACs/PVC, 6 beat run of NSVT.  At follow-up appointment 05/20/2021 he was advised to continue colchicine  for a total of 3 months.  However, after discontinuing he presented to ED 08/30/2021 with chest discomfort.  CRP was 1.8, sed rate 3, BNP 3.8.  Colchicine  and metoprolol  were resumed.  At follow-up 09/2021 colchicine  taper was planned.  Seen 12/19/2021 by Dr. Scarce and reported taking colchicine  every other day with no chest pain.  Saw pharmacy team 01/17/2022 to discuss Acrolyst.   Seen 03/20/22 with no chest pain after > 1 month off colchicine  and metoprolol .  ED visit 04/19/2022 for chest pain.  EKG revealed sinus tachycardia.  CTA and troponin were unremarkable.  Pain described as different than prior myocarditis pain.  He was discharged on Celebrex  200 mg twice daily for 10 days and Pepcid  20 mg twice daily.  Sed rate/CRP at follow-up visit 04/29/2023 were normal and Celebrex  was tapered off.  ED visit 03/21/2023 with chest pain.   Troponin 18 >> 16.  CTA negative for PE.  Discharged on colchicine  0.6 mg daily, Toprol  25 mg daily.  At clinic visit 03/27/2023 sed rate 2, CRP 2.  Cardiac MRI 04/15/2023 with normal LV size and function, LVEF 63%, normal RV, not suggestive of acute myocarditis but could be consistent with prior myocarditis.  Monitor 04/16/2023 predominant NSR, average HR 87 bpm, no significant arrhythmias or pauses.  Last cardiology clinic visit 04/21/2023 with Reche Finder, NP.  He reported no chest pain or lightheadedness.  Colchicine  caused some diarrhea taking it once a day.  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  Safety protocol was reviewed.  He also mention taking hydroxyzine  for anxiety and had stopped it prior to going to ED which he thought contributed to recent episode of chest pain.  He was advised to taper off colchicine  and review Acralyst information for further consideration.  He was encouraged to start walking for exercise and gradually increase intensity.  He was encouraged to return to work after the appointment.  ED visit 09/09/2023 with left-sided chest pain, sharp in nature, and radiating into his back, 10/10 pain severity and associated with shortness of breath.  He was standing up shaving when symptoms began.  Symptoms worse with movement.  EKG revealed sinus tachycardia at 117 bpm.  D-dimer negative, sed rate 1, CRP 1.6, troponin 26.  Recommendation to start colchicine  and follow-up with cardiology.   History of Present Illness Discussed the use of AI scribe software for clinical note transcription with the patient, who gave verbal consent to proceed.  History of Present Illness Joshua [name] is a very pleasant 43 year old male with recurrent myocarditis who presents with chest pain. He reports sharp chest pain that started the day prior to ED visit on 09/09/2023.  Pain transitions to a dull ache  and is described as less severe than previous episodes. The pain worsens when laying down and is accompanied by shortness of breath and coughing. There is no recent cold or tuberculosis exposure to his awareness.  He had stopped metoprolol  after a few months of no chest pain, but he restarted it with a previous prescription he had at home. He resumed colchicine  at 0.6 mg daily after receiving a dose of 0.6 mg x 2 in the ED after chest pain recurred. He notes swelling in his fingers and a recent weight gain of about six pounds, with no significant ankle swelling.  He denies orthopnea, PND, lower extremity swelling, presyncope, syncope.  ROS: See HPI  Studies Reviewed EKG Interpretation Date/Time:  Monday September 14 2023 10:48:39 EDT Ventricular Rate:  73 PR Interval:  152 QRS Duration:  90 QT Interval:  382 QTC Calculation: 420 R Axis:   -10  Text Interpretation: Normal sinus rhythm Minimal voltage criteria for LVH, may be normal variant ( R in aVL ) When compared with ECG of 09-Sep-2023 16:56, Vent. rate has decreased BY  44 BPM Inverted T waves have replaced nonspecific T wave abnormality in Inferior leads Confirmed by Percy Browning [phone]) on 09/14/2023 4:05:32 PM    No results found for: LIPOA  Risk Assessment/Calculations           Physical Exam VS:  BP 124/72   Pulse 73   Ht 5' 8 (1.727 m)   Wt 230 lb 1.6 oz (104.4 kg)   SpO2 94%   BMI 34.99 kg/m    Wt Readings from Last 3 Encounters:  09/14/23 230 lb 1.6 oz (104.4 kg)  09/09/23 215 lb (97.5 kg)  04/21/23 234 lb 3.2 oz (106.2 kg)    GEN: Well nourished, well developed in no acute distress NECK: No JVD; No carotid bruits CARDIAC: RRR, no murmurs, rubs, gallops RESPIRATORY:  Clear to auscultation without rales, wheezing or rhonchi  ABDOMEN: Soft, non-tender, non-distended EXTREMITIES:  No edema; No deformity   Assessment & Plan Chest pain  Myocarditis Recurrent chest pain 1 week prior concerning for myocarditis.  Elevated troponin and CRP levels noted in ED.  CXR with no pleural effusion, normal pulmonary vasculature.  He was advised by Dr. Raford, primary cardiologist, to start acro list for long-term management of myocarditis, however he was concerned about side effects.  He is now willing to start the medication.  -Schedule visit with Pharm D for initiation of Acralyst  Addendum: discussed with Medford Bolk, RPH who advised we will complete paperwork for Acralyst enrollment -Continue colchicine  0.6 mg once daily  -Continue metoprolol  succinate 25 mg once daily  - Advised low sodium diet and good hydration -Consider repeat imaging if symptoms worsen  Abnormal weight gain   He is concerned about recent weight gain but admits to dietary indiscretion.  Weight increased from 215 lbs to 221 lbs.weight gain is a side effect of psychiatric medications.  - Encouraged heart healthy diet avoiding processed food, high sodium foods, saturated fat, sugar, and other simple carbohydrates   Hand edema Bilateral hand edema. He admits to dietary indiscretion and feels he has also experienced weight gain secondary to psychiatric medications.  No significant dyspnea, orthopnea, PND.  No evidence  of volume overload on exam. - Consider echocardiogram to ensure stable heart function if symptoms continue        Dispo: 3 months with Dr. Raford or APP  Signed, Rosaline Bane, NP-C

## 2023-09-13 ENCOUNTER — Telehealth: Payer: Self-pay | Admitting: Student

## 2023-09-13 NOTE — Telephone Encounter (Signed)
 Patient called the after-hours answering service because he was concerned that he had a worsening cough.  The patient was recently hospitalized on 09/09/2023 for recurrent myocarditis.  When I contacted the patient he denied shortness of breath, dizziness, lightheadedness, orthopnea, lower extremity edema, fever, chills, nausea, vomiting, and chest pain.  He reported that he did have some right-sided chest pain last night but denies any current ongoing chest pain.  The patient was concerned that he might have some fluid around his lungs but due to his lack of symptoms I informed him that this was unlikely at this time.  Informed the patient that he can use some over-the-counter Mucinex if the cough bothers him.  Instructed the patient that he should go to the emergency department if he develops worsening shortness of breath, or chest pain.  Plan to keep the patient's office follow-up scheduled for tomorrow with Rosaline Bane. I will forward this message to her.  SignedMorse Clause, PA-C 09/13/2023, 12:46 PM

## 2023-09-14 ENCOUNTER — Ambulatory Visit (INDEPENDENT_AMBULATORY_CARE_PROVIDER_SITE_OTHER): Admitting: Nurse Practitioner

## 2023-09-14 ENCOUNTER — Encounter (HOSPITAL_BASED_OUTPATIENT_CLINIC_OR_DEPARTMENT_OTHER): Payer: Self-pay | Admitting: Nurse Practitioner

## 2023-09-14 VITALS — BP 124/72 | HR 73 | Ht 68.0 in | Wt 230.1 lb

## 2023-09-14 DIAGNOSIS — R600 Localized edema: Secondary | ICD-10-CM | POA: Diagnosis not present

## 2023-09-14 DIAGNOSIS — I319 Disease of pericardium, unspecified: Secondary | ICD-10-CM | POA: Diagnosis not present

## 2023-09-14 DIAGNOSIS — R635 Abnormal weight gain: Secondary | ICD-10-CM

## 2023-09-14 DIAGNOSIS — R079 Chest pain, unspecified: Secondary | ICD-10-CM

## 2023-09-14 MED ORDER — COLCHICINE 0.6 MG PO TABS: 0.6000 mg | ORAL_TABLET | Freq: Every day | ORAL | 1 refills | Status: DC

## 2023-09-14 MED ORDER — METOPROLOL SUCCINATE ER 25 MG PO TB24: 25.0000 mg | ORAL_TABLET | Freq: Every day | ORAL | 2 refills | Status: DC

## 2023-09-14 NOTE — Patient Instructions (Signed)
 Medication Instructions:   Your physician recommends that you continue on your current medications as directed. Please refer to the Current Medication list given to you today.   *If you need a refill on your cardiac medications before your next appointment, please call your pharmacy*  Lab Work:  None ordered.  If you have labs (blood work) drawn today and your tests are completely normal, you will receive your results only by: MyChart Message (if you have MyChart) OR A paper copy in the mail If you have any lab test that is abnormal or we need to change your treatment, we will call you to review the results.  Testing/Procedures:  None ordered.  Follow-Up: At [facility], you and your health needs are our priority.  As part of our continuing mission to provide you with exceptional heart care, our providers are all part of one team.  This team includes your primary Cardiologist (physician) and Advanced Practice Providers or APPs (Physician Assistants and Nurse Practitioners) who all work together to provide you with the care you need, when you need it.  Your next appointment:   3 month(s)  Provider:   Reche Finder, NP    We recommend signing up for the patient portal called MyChart.  Sign up information is provided on this After Visit Summary.  MyChart is used to connect with patients for Virtual Visits (Telemedicine).  Patients are able to view lab/test results, encounter notes, upcoming appointments, etc.  Non-urgent messages can be sent to your provider as well.   To learn more about what you can do with MyChart, go to [email or web address].   Other Instructions  Joshua [name] will send a message to the pharmacy team for your name to put on a cancellation list.

## 2023-09-15 ENCOUNTER — Telehealth: Payer: Self-pay | Admitting: Nurse Practitioner

## 2023-09-15 NOTE — Telephone Encounter (Signed)
 Please call patient and let him know that I have completed the paperwork for him to get started on Acralyst. He does not require pharmacy appointment, a health care professional will come to his home to help him administer the first dose.  Research revealed that patients who stayed on the medication for 18 months had lowest risk of recurrence of myopericarditis.  We are ordering medication for 1 year at this time and will renew prescription at the end of that year if needed.  We will still plan to see him in 3 months.  He will need to come to the office to complete his portion of the paperwork and then we will await medication delivery.

## 2023-09-15 NOTE — Telephone Encounter (Signed)
 Pt returning nurse's call. Please advise

## 2023-09-24 NOTE — Telephone Encounter (Signed)
 Patient returned staff call to follow-up on his paperwork to get Arcalyst medication.

## 2023-09-24 NOTE — Telephone Encounter (Signed)
 Spoke with the pt and was able to locate his updated insurance card in media and will print this off and apply that to his application and fax this back to the company.  Endorsed to the pt that per Kristin Alvstad PharmD, she received another message from the [employer] [facility] people yesterday about the Arcalyst prescription, and they can't get him to answer phone or email and they don't have active insurance info.    Pt stated he never received a call from the company about his Arcalyst prescription, but if they can call him back and leave a message he will return a call back.  Pt states it more than likely came in as spam through his cell, being their number isn't saved in his contacts.   Pt states he does have a valid email and has yet to receive anything from the company.  Pt states the email they should be using is [email or web address].    Will route this message back to Kristin Alvstad RPH, to make her aware of this information.

## 2023-09-29 ENCOUNTER — Telehealth: Payer: Self-pay | Admitting: Pharmacist Clinician (PhC)/ Clinical Pharmacy Specialist

## 2023-09-29 ENCOUNTER — Other Ambulatory Visit (HOSPITAL_COMMUNITY): Payer: Self-pay

## 2023-09-29 DIAGNOSIS — I319 Disease of pericardium, unspecified: Secondary | ICD-10-CM

## 2023-09-29 NOTE — Telephone Encounter (Signed)
 Please clarify drug quantity, form, and day supply

## 2023-09-29 NOTE — Telephone Encounter (Signed)
 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

## 2023-09-30 NOTE — Telephone Encounter (Signed)
 Caller Bernerd) called to follow-up on the prior authorization for patient's Arcalyst medication.

## 2023-10-01 ENCOUNTER — Other Ambulatory Visit (HOSPITAL_COMMUNITY): Payer: Self-pay

## 2023-10-01 NOTE — Telephone Encounter (Signed)
 Patient will need 320mg  loading dose (two `160mg  injections) followed by 160mg  once weekly

## 2023-10-01 NOTE — Telephone Encounter (Signed)
 Thank you.   Has the patient been tested with hepatitis B surface antigen (HBsAg) and hepatitis B core antibody (anti-HBc, HBcAb)? Please advise.

## 2023-10-01 NOTE — Telephone Encounter (Signed)
 Patient was seen by Rosaline RAMAN NP 9/8 in clinic

## 2023-10-02 NOTE — Telephone Encounter (Signed)
 Pharmacy Patient Advocate Encounter  Received notification from [employer] that Prior Authorization for ARCALYST has been DENIED.  Full denial letter will be uploaded to the media tab. See denial reason below. NEEDS HEP B TESTING

## 2023-10-02 NOTE — Telephone Encounter (Signed)
 Pharmacy is calling with the concerns for the medication Acralyst. Please advise

## 2023-10-02 NOTE — Telephone Encounter (Signed)
 He does not recall being tested with HBsAg or anti-HBc.

## 2023-10-02 NOTE — Telephone Encounter (Signed)
PA request has been Submitted.

## 2023-10-05 NOTE — Addendum Note (Signed)
 Addended by: FREDIRICK BEAU B on: 10/05/2023 02:12 PM   Modules accepted: Orders

## 2023-10-05 NOTE — Addendum Note (Signed)
 Addended by: DARRELL BRUCKNER on: 10/05/2023 02:08 PM   Modules accepted: Orders

## 2023-10-05 NOTE — Telephone Encounter (Signed)
 Will you please order these labs and notify patient.

## 2023-10-05 NOTE — Telephone Encounter (Signed)
 Patient needs to have hepatitis B testing before Arcalyst will be approved. Specifically hep B surface antigen and hep B antibody

## 2023-10-07 ENCOUNTER — Telehealth: Payer: Self-pay | Admitting: Pharmacy Technician

## 2023-10-07 ENCOUNTER — Telehealth: Payer: Self-pay | Admitting: Student

## 2023-10-07 NOTE — Telephone Encounter (Signed)
    Received notification from [employer] that Prior Authorization for ARCALYST has been DENIED.  Full denial letter will be uploaded to the media tab. See denial reason below. NEEDS HEP B TESTING

## 2023-10-07 NOTE — Telephone Encounter (Signed)
   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  I see a telephone note a pharmacy tech earlier today that this was denied because patient needs hep B testing. Updated patient on this. He has Hepatitis B labs ordered so I advised him to come and get these drawn. Will cc' this note to pharmacy team in case anything else needs to be done.  Shalen Petrak E Dorlisa Savino, PA-C 10/07/2023 5:33 PM

## 2023-10-09 ENCOUNTER — Telehealth: Payer: Self-pay | Admitting: Cardiovascular Disease

## 2023-10-09 LAB — HEPATITIS B CORE ANTIBODY, IGM: Hep B C IgM: NEGATIVE

## 2023-10-09 LAB — HEPATITIS B SURFACE ANTIGEN: Hepatitis B Surface Ag: NEGATIVE

## 2023-10-09 NOTE — Telephone Encounter (Signed)
 Will forward to Eula Fried NP for review

## 2023-10-09 NOTE — Telephone Encounter (Signed)
 Pt c/o medication issue:  1. Name of Medication: ARCALYST   2. How are you currently taking this medication (dosage and times per day)?    3. Are you having a reaction (difficulty breathing--STAT)? no  4. What is your medication issue? Calling to see if our office going to do appeal the denial, for the patient to get the medication. If so the fax #[phone]. Please advise

## 2023-10-11 ENCOUNTER — Ambulatory Visit: Payer: Self-pay | Admitting: Nurse Practitioner

## 2023-10-12 ENCOUNTER — Telehealth: Payer: Self-pay

## 2023-10-12 ENCOUNTER — Other Ambulatory Visit (HOSPITAL_COMMUNITY): Payer: Self-pay

## 2023-10-12 NOTE — Telephone Encounter (Signed)
 Pharmacy Patient Advocate Encounter  *HEP B LABS CAME BACK. RESUBMITTING REQUEST.  Received notification from Physician's Office that prior authorization for ARCALYST is required/requested.   Insurance verification completed.   The patient is insured through [employer].   Per test claim: PA required; PA submitted to above mentioned insurance via Latent Key/confirmation #/[id number] Status is pending

## 2023-10-12 NOTE — Telephone Encounter (Signed)
 Pharmacy Patient Advocate Encounter  Received notification from [employer] that Prior Authorization for ARCALYST has been APPROVED from 10/12/23 to 10/11/24. Ran test claim, Copay is $4. This test claim was processed through [facility]- copay amounts may vary at other pharmacies due to pharmacy/plan contracts, or as the patient moves through the different stages of their insurance plan.

## 2023-10-13 ENCOUNTER — Other Ambulatory Visit: Payer: Self-pay

## 2023-10-13 ENCOUNTER — Telehealth: Payer: Self-pay | Admitting: Cardiovascular Disease

## 2023-10-13 MED ORDER — RILONACEPT 220 MG ~~LOC~~ SOLR: SUBCUTANEOUS | Status: AC

## 2023-10-13 NOTE — Telephone Encounter (Signed)
 Prescription at pharmacy ([facility]) and support program is aware of this, have reached out to patient for teaching.

## 2023-10-13 NOTE — Telephone Encounter (Signed)
 Reached out to the [facility] via secure chat to ask what to do  So the pharmacy ([facility]) already has the prescription and are in the process of reaching out to him. We just need to add to the med list.  inject 320 mg x 1, then 160 mg once weekly   Advised patient, verbalized understanding

## 2023-10-13 NOTE — Telephone Encounter (Signed)
 Pt calling in regards to ARCALYST.  His insurance has authorized, but he is confused about where to get the Rx filled, and the price. Reached out to Kayla Wolverton and Kristin Alvstad. Pt will have to get Rx from specialty pharmacy. Please advise.

## 2023-10-16 ENCOUNTER — Ambulatory Visit: Admitting: Pharmacist

## 2023-12-14 ENCOUNTER — Ambulatory Visit (HOSPITAL_BASED_OUTPATIENT_CLINIC_OR_DEPARTMENT_OTHER): Admitting: Family

## 2024-01-01 ENCOUNTER — Telehealth (HOSPITAL_BASED_OUTPATIENT_CLINIC_OR_DEPARTMENT_OTHER): Payer: Self-pay | Admitting: Nurse Practitioner

## 2024-01-01 NOTE — Telephone Encounter (Signed)
 Called patient and let him know would have to forward to [place] to give instructions as he has not been told to stop medication.    He just wants to stop taking both as he is not having any problems and researched that long term Colchicine  can have effects on kidneys.  His reason for stopping Metoprolol  is he is wanting to start working out.    He knows we will call him back next week after Rosaline reviews

## 2024-01-01 NOTE — Telephone Encounter (Signed)
 Pt c/o medication issue:  1. Name of Medication:   metoprolol  succinate (TOPROL -XL) 25 MG 24 hr tablet                 colchicine  0.6 MG tablet   2. How are you currently taking this medication (dosage and times per day)? As written  3. Are you having a reaction (difficulty breathing--STAT)? No  4. What is your medication issue? PT states he does not need to take meds anymore and is requesting information on how to slowly stop taking the meds

## 2024-01-05 NOTE — Telephone Encounter (Signed)
 Patient made us  aware he is not on Acralyst. He would like to stop colchicine . Advised pain will likely return if colchicine  is stopped, but he can monitor for symptoms and resume colchicine  if pain returns.

## 2024-01-05 NOTE — Telephone Encounter (Signed)
 S/w pt  Rosaline would recommend decrease Toprol  1/2 tablet ( 12.5 Mg) daily X 3 days than discontinue. Pt is not on acralyst and would like to D/C colchicine . Pt is advised if pt D/C colchicine  chest pain might come back and will have to restart colchicine . Will updated pt's medication list.

## 2024-01-05 NOTE — Telephone Encounter (Signed)
 Would recommend that he decrease Toprol  to 1/2 tablet (12.5 mg) for 3 days then discontinue. Let us  know if he has any concerning side effects after stopping or has other questions.

## 2024-01-05 NOTE — Addendum Note (Signed)
 Addended by: TAMEA EDSEL SAUNDERS on: 01/05/2024 12:58 PM   Modules accepted: Orders

## 2024-02-04 ENCOUNTER — Ambulatory Visit (HOSPITAL_BASED_OUTPATIENT_CLINIC_OR_DEPARTMENT_OTHER): Admitting: Nurse Practitioner
